# Patient Record
Sex: Female | Born: 2000 | Race: Black or African American | Hispanic: No | Marital: Single | Smoking: Never smoker
Health system: Southern US, Community
[De-identification: ages and names within clinical notes are randomized; demographics above are authoritative.]

## PROBLEM LIST (undated history)

## (undated) DIAGNOSIS — O24419 Gestational diabetes mellitus in pregnancy, unspecified control: Secondary | ICD-10-CM

## (undated) DIAGNOSIS — Z789 Other specified health status: Secondary | ICD-10-CM

## (undated) DIAGNOSIS — D649 Anemia, unspecified: Secondary | ICD-10-CM

## (undated) DIAGNOSIS — O2686 Pruritic urticarial papules and plaques of pregnancy (PUPPP): Secondary | ICD-10-CM

## (undated) HISTORY — DX: Other specified health status: Z78.9

## (undated) HISTORY — PX: NO PAST SURGERIES: SHX2092

---
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
## 2019-10-15 DIAGNOSIS — H04123 Dry eye syndrome of bilateral lacrimal glands: Secondary | ICD-10-CM | POA: Diagnosis not present

## 2019-10-15 DIAGNOSIS — H40033 Anatomical narrow angle, bilateral: Secondary | ICD-10-CM | POA: Diagnosis not present

## 2020-11-07 ENCOUNTER — Encounter (HOSPITAL_COMMUNITY): Payer: Self-pay

## 2020-11-07 ENCOUNTER — Emergency Department (HOSPITAL_COMMUNITY)
Admission: EM | Admit: 2020-11-07 | Discharge: 2020-11-07 | Disposition: A | Discharge status: Home / Self Care | Payer: Medicaid Other | Attending: Emergency Medicine | Admitting: Emergency Medicine

## 2020-11-07 ENCOUNTER — Other Ambulatory Visit: Payer: Self-pay

## 2020-11-07 DIAGNOSIS — W4904XA Ring or other jewelry causing external constriction, initial encounter: Secondary | ICD-10-CM | POA: Diagnosis not present

## 2020-11-07 DIAGNOSIS — S60414A Abrasion of right ring finger, initial encounter: Secondary | ICD-10-CM | POA: Diagnosis not present

## 2020-11-07 DIAGNOSIS — S60944A Unspecified superficial injury of right ring finger, initial encounter: Secondary | ICD-10-CM | POA: Diagnosis present

## 2020-11-07 DIAGNOSIS — S60449A External constriction of unspecified finger, initial encounter: Secondary | ICD-10-CM

## 2020-11-07 NOTE — ED Provider Notes (Signed)
[facility] EMERGENCY DEPARTMENT Provider Note   CSN: [id number] Arrival date & time: 11/07/20  1324     History Chief Complaint  Patient presents with  . Finger Injury    Ring stuck on finger    Marissa Chavez is a 19 y.o. female.  Marissa Chavez is a 19 y.o. female who is otherwise healthy, presents to the ED for evaluation of ring stuck on her right ring finger.  She states she typically wears this ring every day, woke up during the night and noted some pain and swelling to that ring finger and that she could not get her ring off.  She has tried multiple times to pull it off at home but she states that the seem to worsen the swelling and caused a small abrasion to the skin.  Denies any prior injury to the finger or hand.  No numbness tingling or weakness. Here requesting the ring be cut off.        History reviewed. No pertinent past medical history.  There are no problems to display for this patient.   History reviewed. No pertinent surgical history.   OB History   No obstetric history on file.     No family history on file.     Home Medications Prior to Admission medications   Not on File    Allergies    Patient has no known allergies.  Review of Systems   Review of Systems  Constitutional: Negative for chills and fever.  Musculoskeletal: Positive for joint swelling.  Skin: Positive for color change and wound. Negative for rash.  Neurological: Negative for weakness and numbness.    Physical Exam Updated Vital Signs BP 125/86   Pulse 73   Temp 98.3 F (36.8 C)   Resp 14   Ht 5\' 7"  (1.702 m)   Wt 56.7 kg   SpO2 100%   BMI 19.58 kg/m   Physical Exam Vitals and nursing note reviewed.  Constitutional:      General: She is not in acute distress.    Appearance: Normal appearance. She is well-developed and well-nourished. She is not ill-appearing or diaphoretic.  HENT:     Head: Normocephalic and atraumatic.  Eyes:      General:        Right eye: No discharge.        Left eye: No discharge.  Pulmonary:     Effort: Pulmonary effort is normal. No respiratory distress.  Musculoskeletal:     Comments: There slight swelling and erythema of the right ring finger, patient has full range of motion of the finger, normal sensation and strength, the ring that was stuck on the finger has already been removed, there is a slight abrasion just above where the ring was on the finger from patient trying to remove it but no other cuts or wounds to the finger or signs of infection.  Skin:    General: Skin is warm and dry.  Neurological:     Mental Status: She is alert and oriented to person, place, and time.     Coordination: Coordination normal.  Psychiatric:        Mood and Affect: Mood and affect and mood normal.        Behavior: Behavior normal.     ED Results / Procedures / Treatments   Labs (all labs ordered are listed, but only abnormal results are displayed) Labs Reviewed - No data to display  EKG None  Radiology  No results found.  Procedures Procedures (including critical care time)  Medications Ordered in ED Medications - No data to display  ED Course  I have reviewed the triage vital signs and the nursing notes.  Pertinent labs & imaging results that were available during my care of the patient were reviewed by me and considered in my medical decision making (see chart for details).    MDM Rules/Calculators/A&P                          19 year old female presents with ring stuck on her right ring finger, she tried multiple times to remove it at home but due to slight swelling she was not able to get the ring off and came in requesting it be cut.  ED tech was able to cut ring off, she has some very mild swelling but no other signs of infection, small abrasion was treated with bacitracin and a bandage.  Finger is neurovascularly intact with full range of motion.  Discussed that ring can likely be  fixed and enlarged at a jewelry store.  Instructed to avoid placing any rings or jewelry on the hand until all redness and swelling has completely resolved.  Discharged home in good condition.   Final Clinical Impression(s) / ED Diagnoses Final diagnoses:  Tight ring on finger    Rx / DC Orders ED Discharge Orders    None       Legrand Rams 11/07/20 0809    Maia Plan, MD 11/08/20 [phone]

## 2020-11-07 NOTE — ED Notes (Signed)
Ring cut off, and given to patient.

## 2020-11-07 NOTE — Discharge Instructions (Signed)
We were able to remove the ring, you can apply antibiotic cream and a bandage and swelling should improve. Avoid wearing any rings on this hand until swelling resolves completely

## 2020-11-07 NOTE — ED Triage Notes (Signed)
Patient here with ring stuck on right ring finger for several hours, complains of pain to same. Requesting ring be cut off

## 2020-11-28 NOTE — L&D Delivery Note (Signed)
Operative Delivery Note At 11:23 AM a viable and healthy female was delivered via Vaginal, Vacuum [employer] banker, operational).  Presentation: vertex; Position: Left,, Occiput,, Anterior; Station: +3.  Verbal consent: obtained from patient.  Risks and benefits discussed in detail.  Risks include, but are not limited to the risks of anesthesia, bleeding, infection, damage to maternal tissues, fetal cephalhematoma.  There is also the risk of inability to effect vaginal delivery of the head, or shoulder dystocia that cannot be resolved by established maneuvers, leading to the need for emergency cesarean section.  APGAR: 8, 9; weight pending.   Placenta status: spontaneous, intact. Large amount of bleeding and dark blood gush after delivery of fetal head with likely placental abruption. Cord: 3 vessel with the following complications: .  Cord pH: 7.04  Anesthesia:  epidural Instruments: Kiwi Episiotomy: None Lacerations: 1st degree;Perineal Suture Repair: 3.0 Monocryl Est. Blood Loss (mL): 500  Mom to postpartum.  Baby to Couplet care / Skin to Skin.  Marissa Chavez is a 20 y.o. female G1P0000 with IUP at [redacted]w[redacted]d admitted for active labor and vaginal bleeding .  She progressed rapidly without augmentation to complete and pushed through 2-3 contractions to deliver. FHR tracing with repetitive lates so CNM called to room. Pt was found to be complete with BBOW to introitus. FHR deceleration down to 80s remaining prolonged with bleeding on pad of ~ 100 ml with clots.  AROM with clear fluid and pt then complete and +2 station.  Pt pushed well x 1 contraction, moving baby to +3 station. Dr Ashok Pall to room and consented pt for vacuum. Vacuum applied and infant delivered with good patient effort and gentle traction by Dr Ashok Pall.  Infant transitioned well on maternal abdomen with stimulation with bulb syringe suction of mouth and nose by CNM and towel rub by RN. Apgar 8 at 1 minute.  Infant remained on maternal abdomen.  Cord clamping  delayed by 1 minute then clamped by Dr Ashok Pall and cut by significant other.  Placenta intact and spontaneous, brisk bleeding at time of delivery slowed and bleeding minimal after placenta delivered.  TXA IV and Cytotec given rectally along with routine administration of IV Pitocin.  First degree laceration repaired without difficulty.  Mom and baby stable prior to transfer to postpartum. She plans on breastfeeding.    Sharen Counter 08/18/2021, 12:20 PM

## 2021-02-08 LAB — OB RESULTS CONSOLE RUBELLA ANTIBODY, IGM: Rubella: IMMUNE

## 2021-02-08 LAB — OB RESULTS CONSOLE ABO/RH: RH Type: POSITIVE

## 2021-02-08 LAB — OB RESULTS CONSOLE HGB/HCT, BLOOD
HCT: 39 (ref 29–41)
Hemoglobin: 12.6

## 2021-02-08 LAB — OB RESULTS CONSOLE ANTIBODY SCREEN: Antibody Screen: NEGATIVE

## 2021-02-08 LAB — OB RESULTS CONSOLE HIV ANTIBODY (ROUTINE TESTING): HIV: NONREACTIVE

## 2021-02-08 LAB — OB RESULTS CONSOLE HEPATITIS B SURFACE ANTIGEN: Hepatitis B Surface Ag: NEGATIVE

## 2021-02-08 LAB — HEPATITIS C ANTIBODY: HCV Ab: NEGATIVE

## 2021-02-08 LAB — OB RESULTS CONSOLE RPR: RPR: NONREACTIVE

## 2021-02-08 LAB — OB RESULTS CONSOLE PLATELET COUNT: Platelets: 239

## 2021-02-08 LAB — SICKLE CELL SCREEN: Sickle Cell Screen: NEGATIVE

## 2021-02-08 LAB — OB RESULTS CONSOLE GC/CHLAMYDIA
Chlamydia: NEGATIVE
Gonorrhea: NEGATIVE

## 2021-03-10 ENCOUNTER — Encounter: Payer: Self-pay | Admitting: *Deleted

## 2021-03-29 ENCOUNTER — Ambulatory Visit (INDEPENDENT_AMBULATORY_CARE_PROVIDER_SITE_OTHER): Payer: Medicaid Other | Admitting: Student

## 2021-03-29 ENCOUNTER — Encounter: Payer: Self-pay | Admitting: Student

## 2021-03-29 ENCOUNTER — Other Ambulatory Visit: Payer: Self-pay

## 2021-03-29 VITALS — BP 117/72 | HR 83 | Wt 127.2 lb

## 2021-03-29 DIAGNOSIS — O418X20 Other specified disorders of amniotic fluid and membranes, second trimester, not applicable or unspecified: Secondary | ICD-10-CM | POA: Diagnosis not present

## 2021-03-29 DIAGNOSIS — O468X2 Other antepartum hemorrhage, second trimester: Secondary | ICD-10-CM | POA: Diagnosis not present

## 2021-03-29 DIAGNOSIS — O0990 Supervision of high risk pregnancy, unspecified, unspecified trimester: Secondary | ICD-10-CM | POA: Insufficient documentation

## 2021-03-29 DIAGNOSIS — O468X9 Other antepartum hemorrhage, unspecified trimester: Secondary | ICD-10-CM | POA: Insufficient documentation

## 2021-03-29 DIAGNOSIS — O418X90 Other specified disorders of amniotic fluid and membranes, unspecified trimester, not applicable or unspecified: Secondary | ICD-10-CM | POA: Insufficient documentation

## 2021-03-29 DIAGNOSIS — Z3400 Encounter for supervision of normal first pregnancy, unspecified trimester: Secondary | ICD-10-CM

## 2021-03-29 NOTE — Progress Notes (Signed)
  Subjective:    Marissa Chavez is being seen today for her first obstetrical visit.  This is not a planned pregnancy. She is at [redacted]w[redacted]d gestation. Her obstetrical history is significant for nothing. Marissa Chavez Kitchen She was a patient of [facility] but she has transferred to [facility] due to change in insurance. She is unhappy about this transfer. She is also unhappy because she thought she was going to have an [place] today and find out the gender. She has planned her gender reveal party for May 14 and is upset about the delay in scheduling her [place]. Relationship with FOB: not together. Patient does intend to breast feed. Pregnancy history fully reviewed.  Patient reports reports normal discharge, no bleeding, no contractions. . [place] at [facility] showed [facility] but patient has had no bleeding.    Review of Systems:   Review of Systems  Constitutional: Negative.   HENT: Negative.   Genitourinary: Negative.   Musculoskeletal: Negative.   Neurological: Negative.   Psychiatric/Behavioral: Negative.     Objective:     BP 117/72   Pulse 83   Wt 127 lb 3.2 oz (57.7 kg)   LMP 12/09/2020 (Approximate)   BMI 19.92 kg/m  Physical Exam Constitutional:      Appearance: Normal appearance.  Pulmonary:     Effort: Pulmonary effort is normal.  Musculoskeletal:     Cervical back: Normal range of motion.  Neurological:     Mental Status: She is alert.     Exam    Assessment:    Pregnancy: G2P0 Patient Active Problem List   Diagnosis Date Noted  . Supervision of low-risk first pregnancy 03/29/2021       Plan:     Initial labs drawn. Prenatal vitamins. Problem list reviewed and updated. AFP3 discussed: ordered. Role of ultrasound in pregnancy discussed; fetal survey: ordered. Amniocentesis discussed: not indicated. Follow up in 4 weeks. 75% of 30 min visit spent on counseling and coordination of care.  The nature of [facility] with multiple MDs and other Advanced Practice  Providers was explained to patient; also emphasized that residents, students are part of our team. -Discussed dating. Patient states that her period was irregular and she is not sure about her LMP. She says that Marissa Chavez was using her 12 week scan for dating. Will confirm at the [place] -Patient [place] scheduled for soonest possible date (May 18); advised patient that genetic testing may come back earlier but it would be prudent to delay her gender reveal party -Genetic testing done today -explained to patient that our practice will take great care of her, while we know she is disappointed about transferring.  -Discussed that she can see the same provider for the first few visits in order to get comfortable with the practice  Marissa Chavez [facility] 03/29/2021

## 2021-03-29 NOTE — Progress Notes (Signed)
Patient complains of occasional pressure in pelvic

## 2021-03-30 ENCOUNTER — Telehealth: Payer: Self-pay

## 2021-03-30 ENCOUNTER — Encounter: Payer: Self-pay | Admitting: *Deleted

## 2021-03-30 LAB — AFP, SERUM, OPEN SPINA BIFIDA
AFP MoM: 0.86
AFP Value: 51.6 ng/mL
Gest. Age on Collection Date: 18.5 weeks
Maternal Age At EDD: 20.9 yr
OSBR Risk 1 IN: 10000
Test Results:: NEGATIVE
Weight: 127 [lb_av]

## 2021-03-30 NOTE — Telephone Encounter (Signed)
Called patient today at 3:35pm and did not get an answer. I called patient again at 4:10pm and did not get an answer but I did leave a message stating that her ultrasound appointment has been scheduled for Apr 14, 2021 at 8am. I also stated that her ultrasound appointment will be taken place at our location on the second floor (Maternal Fetal Medicine).  Jaymie Misch, CMA

## 2021-03-31 ENCOUNTER — Encounter: Payer: Self-pay | Admitting: General Practice

## 2021-03-31 LAB — CULTURE, OB URINE

## 2021-03-31 LAB — URINE CULTURE, OB REFLEX

## 2021-04-09 ENCOUNTER — Inpatient Hospital Stay (HOSPITAL_BASED_OUTPATIENT_CLINIC_OR_DEPARTMENT_OTHER): Payer: Medicaid Other

## 2021-04-09 ENCOUNTER — Encounter (HOSPITAL_COMMUNITY): Payer: Self-pay | Admitting: Obstetrics & Gynecology

## 2021-04-09 ENCOUNTER — Inpatient Hospital Stay (HOSPITAL_COMMUNITY)
Admission: AD | Admit: 2021-04-09 | Discharge: 2021-04-09 | Disposition: A | Discharge status: Home / Self Care | Payer: Medicaid Other | Attending: Obstetrics & Gynecology | Admitting: Obstetrics & Gynecology

## 2021-04-09 DIAGNOSIS — Z3A20 20 weeks gestation of pregnancy: Secondary | ICD-10-CM

## 2021-04-09 DIAGNOSIS — O26892 Other specified pregnancy related conditions, second trimester: Secondary | ICD-10-CM | POA: Diagnosis present

## 2021-04-09 DIAGNOSIS — R102 Pelvic and perineal pain: Secondary | ICD-10-CM | POA: Diagnosis not present

## 2021-04-09 DIAGNOSIS — Z3686 Encounter for antenatal screening for cervical length: Secondary | ICD-10-CM | POA: Diagnosis not present

## 2021-04-09 DIAGNOSIS — O321XX0 Maternal care for breech presentation, not applicable or unspecified: Secondary | ICD-10-CM

## 2021-04-09 DIAGNOSIS — R109 Unspecified abdominal pain: Secondary | ICD-10-CM

## 2021-04-09 DIAGNOSIS — O26899 Other specified pregnancy related conditions, unspecified trimester: Secondary | ICD-10-CM

## 2021-04-09 LAB — URINALYSIS, ROUTINE W REFLEX MICROSCOPIC
Bacteria, UA: NONE SEEN
Bilirubin Urine: NEGATIVE
Glucose, UA: NEGATIVE mg/dL
Hgb urine dipstick: NEGATIVE
Ketones, ur: NEGATIVE mg/dL
Nitrite: NEGATIVE
Protein, ur: NEGATIVE mg/dL
Specific Gravity, Urine: 1.013 (ref 1.005–1.030)
pH: 8 (ref 5.0–8.0)

## 2021-04-09 MED ORDER — CYCLOBENZAPRINE HCL 5 MG PO TABS: 5.0000 mg | ORAL_TABLET | Freq: Three times a day (TID) | ORAL | 0 refills | Status: DC | PRN

## 2021-04-09 NOTE — Discharge Instructions (Signed)

## 2021-04-09 NOTE — MAU Provider Note (Signed)
History     CSN: [id number]  Arrival date and time: 04/09/21 1705   Event Date/Time   First Provider Initiated Contact with Patient 04/09/21 1925      Chief Complaint  Patient presents with  . Abdominal Cramping  . Abdominal Pain   HPI Marissa Chavez is a 20 y.o. G1P0000 at [redacted]w[redacted]d who presents with abdominal pain & pressure. Symptoms started yesterday and worsened this morning. Describe pressure in her vagina & pelvic pain that is worse when standing & walking. States the pressure feels like something is going to fall out of her vagina. Rates pain 5/10. Hasn't treated symptoms. Nothing makes better. Denies vaginal bleeding, LOF, vaginal discharge, or n/v/d.   OB History    Gravida  1   Para  0   Term  0   Preterm  0   AB  0   Living  0     SAB  0   IAB  0   Ectopic  0   Multiple  0   Live Births  0           Past Medical History:  Diagnosis Date  . Medical history non-contributory     Past Surgical History:  Procedure Laterality Date  . NO PAST SURGERIES      Family History  Problem Relation Age of Onset  . Hypertension Mother   . Depression Father   . Hypertension Maternal Grandfather   . Hypertension Paternal Grandmother   . Kidney disease Paternal Grandmother     Social History   Tobacco Use  . Smoking status: Never Smoker  . Smokeless tobacco: Never Used  Vaping Use  . Vaping Use: Never used  Substance Use Topics  . Alcohol use: Not Currently  . Drug use: Not Currently    Allergies: No Known Allergies  Medications Prior to Admission  Medication Sig Dispense Refill Last Dose  . Prenatal Vit-Fe Fumarate-FA (MULTIVITAMIN-PRENATAL) 27-0.8 MG TABS tablet Take 1 tablet by mouth daily at 12 noon.   04/08/2021 at Unknown time    Review of Systems  Constitutional: Negative.   Gastrointestinal: Positive for abdominal pain. Negative for constipation, diarrhea, nausea and vomiting.  Genitourinary: Positive for pelvic pain and vaginal  pain. Negative for dysuria, vaginal bleeding and vaginal discharge.   Physical Exam   Blood pressure 135/82, pulse 80, temperature 98 F (36.7 C), temperature source Oral, resp. rate 18, height 5\' 7"  (1.702 m), weight 59.4 kg, last menstrual period 12/09/2020, SpO2 100 %.  Physical Exam Vitals and nursing note reviewed. Exam conducted with a chaperone present.  Constitutional:      Appearance: She is well-developed.  Eyes:     General: No scleral icterus. Pulmonary:     Effort: Pulmonary effort is normal. No respiratory distress.  Abdominal:     Palpations: Abdomen is soft.  Genitourinary:    Comments: Cervix closed/firm/posterior Skin:    General: Skin is warm and dry.  Neurological:     Mental Status: She is alert.  Psychiatric:        Mood and Affect: Mood normal.        Behavior: Behavior normal.     MAU Course  Procedures Results for orders placed or performed during the hospital encounter of 04/09/21 (from the past 24 hour(s))  Urinalysis, Routine w reflex microscopic Urine, Clean Catch     Status: Abnormal   Collection Time: 04/09/21  7:03 PM  Result Value Ref Range   Color, Urine YELLOW YELLOW  APPearance HAZY (A) CLEAR   Specific Gravity, Urine 1.013 1.005 - 1.030   pH 8.0 5.0 - 8.0   Glucose, UA NEGATIVE NEGATIVE mg/dL   Hgb urine dipstick NEGATIVE NEGATIVE   Bilirubin Urine NEGATIVE NEGATIVE   Ketones, ur NEGATIVE NEGATIVE mg/dL   Protein, ur NEGATIVE NEGATIVE mg/dL   Nitrite NEGATIVE NEGATIVE   Leukocytes,Ua LARGE (A) NEGATIVE   RBC / HPF 0-5 0 - 5 RBC/hpf   WBC, UA 0-5 0 - 5 WBC/hpf   Bacteria, UA NONE SEEN NONE SEEN   Squamous Epithelial / LPF 11-20 0 - 5   Mucus PRESENT    No results found.  MDM FHR present via doppler U/A negative Cervix closed Cervical length 3.6 cm Pt stable do for discharge   Assessment and Plan   1. Abdominal pain during pregnancy in second trimester   2. [redacted] weeks gestation of pregnancy   3. Pain of round ligament  during pregnancy    -recommend maternity support belt -rx small amount of flexeril  -take tylenol prn -reviewed reasons to return to MAU  Judeth Horn 04/09/2021, 7:25 PM

## 2021-04-09 NOTE — MAU Note (Signed)
PT c/o abdominal pressure and cramping which is mostly at night or with movement. Worse today. Pain is 7/10. Has not taken anything for this. Denies VB, normal discharge.

## 2021-04-14 ENCOUNTER — Ambulatory Visit: Payer: Medicaid Other | Attending: Student

## 2021-04-14 ENCOUNTER — Other Ambulatory Visit: Payer: Self-pay

## 2021-04-14 DIAGNOSIS — Z3400 Encounter for supervision of normal first pregnancy, unspecified trimester: Secondary | ICD-10-CM | POA: Insufficient documentation

## 2021-04-20 ENCOUNTER — Telehealth: Payer: Self-pay | Admitting: Lactation Services

## 2021-04-20 NOTE — Telephone Encounter (Signed)
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   Reviewed it is recommended she call [employer] at [phone] to set up telephone Genetic Counseling Session to discuss results.   Reviewed that it is recommended FOB be tested to see if he carries the same gene.   FOB is planning to come to next appointment on 6/2 and can have testing done at that time.   Patient with no questions or concerns at this time.

## 2021-04-21 ENCOUNTER — Encounter: Payer: Self-pay | Admitting: *Deleted

## 2021-04-29 ENCOUNTER — Encounter: Payer: Self-pay | Admitting: Medical

## 2021-04-29 ENCOUNTER — Ambulatory Visit (INDEPENDENT_AMBULATORY_CARE_PROVIDER_SITE_OTHER): Payer: Medicaid Other | Admitting: Medical

## 2021-04-29 ENCOUNTER — Other Ambulatory Visit: Payer: Medicaid Other

## 2021-04-29 ENCOUNTER — Other Ambulatory Visit: Payer: Self-pay

## 2021-04-29 VITALS — BP 105/70 | HR 89 | Wt 137.1 lb

## 2021-04-29 DIAGNOSIS — D563 Thalassemia minor: Secondary | ICD-10-CM | POA: Insufficient documentation

## 2021-04-29 DIAGNOSIS — Z3A23 23 weeks gestation of pregnancy: Secondary | ICD-10-CM

## 2021-04-29 DIAGNOSIS — O468X2 Other antepartum hemorrhage, second trimester: Secondary | ICD-10-CM

## 2021-04-29 DIAGNOSIS — Z3402 Encounter for supervision of normal first pregnancy, second trimester: Secondary | ICD-10-CM

## 2021-04-29 DIAGNOSIS — O418X20 Other specified disorders of amniotic fluid and membranes, second trimester, not applicable or unspecified: Secondary | ICD-10-CM

## 2021-04-29 NOTE — Patient Instructions (Addendum)
Second Trimester of Pregnancy  The second trimester of pregnancy is from week 13 through week 27. This is also called months 4 through 6 of pregnancy. This is often the time when you feel your best. During the second trimester:  Morning sickness is less or has stopped.  You may have more energy.  You may feel hungry more often. At this time, your unborn baby (fetus) is growing very fast. At the end of the sixth month, the unborn baby may be up to 12 inches long and weigh about 1 pounds. You will likely start to feel the baby move between 16 and 20 weeks of pregnancy. Body changes during your second trimester Your body continues to go through many changes during this time. The changes vary and generally return to normal after the baby is born. Physical changes  You will gain more weight.  You may start to get stretch marks on your hips, belly (abdomen), and breasts.  Your breasts will grow and may hurt.  Dark spots or blotches may develop on your face.  A dark line from your belly button to the pubic area (linea nigra) may appear.  You may have changes in your hair. Health changes  You may have headaches.  You may have heartburn.  You may have trouble pooping (constipation).  You may have hemorrhoids or swollen, bulging veins (varicose veins).  Your gums may bleed.  You may pee (urinate) more often.  You may have back pain. Follow these instructions at home: Medicines  Take over-the-counter and prescription medicines only as told by your doctor. Some medicines are not safe during pregnancy.  Take a prenatal vitamin that contains at least 600 micrograms (mcg) of folic acid. Eating and drinking  Eat healthy meals that include: ? Fresh fruits and vegetables. ? Whole grains. ? Good sources of protein, such as meat, eggs, or tofu. ? Low-fat dairy products.  Avoid raw meat and unpasteurized juice, milk, and cheese.  You may need to take these actions to prevent or  treat trouble pooping: ? Drink enough fluids to keep your pee (urine) pale yellow. ? Eat foods that are high in fiber. These include beans, whole grains, and fresh fruits and vegetables. ? Limit foods that are high in fat and sugar. These include fried or sweet foods. Activity  Exercise only as told by your doctor. Most people can do their usual exercise during pregnancy. Try to exercise for 30 minutes at least 5 days a week.  Stop exercising if you have pain or cramps in your belly or lower back.  Do not exercise if it is too hot or too humid, or if you are in a place of great height (high altitude).  Avoid heavy lifting.  If you choose to, you may have sex unless your doctor tells you not to. Relieving pain and discomfort  Wear a good support bra if your breasts are sore.  Take warm water baths (sitz baths) to soothe pain or discomfort caused by hemorrhoids. Use hemorrhoid cream if your doctor approves.  Rest with your legs raised (elevated) if you have leg cramps or low back pain.  If you develop bulging veins in your legs: ? Wear support hose as told by your doctor. ? Raise your feet for 15 minutes, 3-4 times a day. ? Limit salt in your food. Safety  Wear your seat belt at all times when you are in a car.  Talk with your doctor if someone is hurting you or yelling  at you a lot. Lifestyle  Do not use hot tubs, steam rooms, or saunas.  Do not douche. Do not use tampons or scented sanitary pads.  Avoid cat litter boxes and soil used by cats. These carry germs that can harm your baby and can cause a loss of your baby by miscarriage or stillbirth.  Do not use herbal medicines, illegal drugs, or medicines that are not approved by your doctor. Do not drink alcohol.  Do not smoke or use any products that contain nicotine or tobacco. If you need help quitting, ask your doctor. General instructions  Keep all follow-up visits. This is important.  Ask your doctor about local  prenatal classes.  Ask your doctor about the right foods to eat or for help finding a counselor. Where to find more information  American Pregnancy Association: americanpregnancy.org  [employer] of Obstetricians and Gynecologists: www.acog.org  Office on [employer] Health: MightyReward.co.nz Contact a doctor if:  You have a headache that does not go away when you take medicine.  You have changes in how you see, or you see spots in front of your eyes.  You have mild cramps, pressure, or pain in your lower belly.  You continue to feel like you may vomit (nauseous), you vomit, or you have watery poop (diarrhea).  You have bad-smelling fluid coming from your vagina.  You have pain when you pee or your pee smells bad.  You have very bad swelling of your face, hands, ankles, feet, or legs.  You have a fever. Get help right away if:  You are leaking fluid from your vagina.  You have spotting or bleeding from your vagina.  You have very bad belly cramping or pain.  You have trouble breathing.  You have chest pain.  You faint.  You have not felt your baby move for the time period told by your doctor.  You have new or increased pain, swelling, or redness in an arm or leg. Summary  The second trimester of pregnancy is from week 13 through week 27 (months 4 through 6).  Eat healthy meals.  Exercise as told by your doctor. Most people can do their usual exercise during pregnancy.  Do not use herbal medicines, illegal drugs, or medicines that are not approved by your doctor. Do not drink alcohol.  Call your doctor if you get sick or if you notice anything unusual about your pregnancy. This information is not intended to replace advice given to you by your health care provider. Make sure you discuss any questions you have with your health care provider. Document Revised: 04/22/2020 Document Reviewed: 02/27/2020 Elsevier Patient Education  2021 Elsevier  Inc.  Childbirth Education Options: [facility] Department Classes:  Childbirth education classes can help you get ready for a positive parenting experience. You can also meet other expectant parents and get free stuff for your baby. Each class runs for five weeks on the same night and costs $45 for the mother-to-be and her support person. Medicaid covers the cost if you are eligible. Call [phone] to register. [facility] Childbirth Education:  [phone] or [phone] or [email or web address].[email or web address] .[email or web address]  Baby & Me Class: Discuss newborn & infant parenting and family adjustment issues with other new mothers in a relaxed environment. Each week brings a new speaker or baby-centered activity. We encourage new mothers to join [place] every Thursday at 11:00am. Babies birth until crawling. No registration or fee. Daddy [employer]: This course offers Dads-to-be the tools and knowledge needed to feel  confident on their journey to becoming new fathers. Experienced dads, who have been trained as coaches, teach dads-to-be how to hold, comfort, diaper, swaddle and play with their infant while being able to support the new mom as well. A class for men taught by men. $25/dad Big Brother/Big Sister: Let your children share in the joy of a new brother or sister in this special class designed just for them. Class includes discussion about how families care for babies: swaddling, holding, diapering, safety as well as how they can be helpful in their new role. This class is designed for children ages 2 to 57, but any age is welcome. Please register each child individually. $5/child  Mom Talk: This mom-led group offers support and connection to mothers as they journey through the adjustments and struggles of that sometimes overwhelming first year after the birth of a child. Tuesdays at 10:00am and Thursdays at 6:00pm. Babies welcome. No registration or fee. Breastfeeding Support Group: This group is a  mother-to-mother support circle where moms have the opportunity to share their breastfeeding experiences. A Lactation Consultant is present for questions and concerns. Meets each Tuesday at 11:00am. No fee or registration. Breastfeeding Your Baby: Learn what to expect in the first days of breastfeeding your newborn.  This class will help you feel more confident with the skills needed to begin your breastfeeding experience. Many new mothers are concerned about breastfeeding after leaving the hospital. This class will also address the most common fears and challenges about breastfeeding during the first few weeks, months and beyond. (call for fee) Comfort Techniques and Tour: This 2 hour interactive class will provide you the opportunity to learn & practice hands-on techniques that can help relieve some of the discomfort of labor and encourage your baby to rotate toward the best position for birth. You and your partner will be able to try a variety of labor positions with birth balls and rebozos as well as practice breathing, relaxation, and visualization techniques. A tour of the [facility] is included with this class. $20 per registrant and support person Childbirth Class- Weekend Option: This class is a Weekend version of our Birth & Baby series. It is designed for parents who have a difficult time fitting several weeks of classes into their schedule. It covers the care of your newborn and the basics of labor and childbirth. It also includes a Maternity Care Center Tour of [facility] and lunch. The class is held two consecutive days: beginning on Friday evening from 6:30 - 8:30 p.m. and the next day, Saturday from 9 a.m. - 4 p.m. (call for fee) Linden Dolin Class: Interested in a waterbirth?  This informational class will help you discover whether waterbirth is the right fit for you. Education about waterbirth itself, supplies you would need and how to assemble your support team  is what you can expect from this class. Some obstetrical practices require this class in order to pursue a waterbirth. (Not all obstetrical practices offer waterbirth-check with your healthcare provider.) Register only the expectant mom, but you are encouraged to bring your partner to class! Required if planning waterbirth, no fee. Infant/Child CPR: Parents, grandparents, babysitters, and friends learn Cardio-Pulmonary Resuscitation skills for infants and children. You will also learn how to treat both conscious and unconscious choking in infants and children. This Family & Friends program does not offer certification. Register each participant individually to ensure that enough mannequins are available. (Call for fee) Grandparent Love: Expecting a grandbaby? This class  is for you! Learn about the latest infant care and safety recommendations and ways to support your own child as he or she transitions into the parenting role. Taught by Registered Nurses who are childbirth instructors, but most importantly...they are grandmothers too! $10/person. Childbirth Class- Natural Childbirth: This series of 5 weekly classes is for expectant parents who want to learn and practice natural methods of coping with the process of labor and childbirth. Relaxation, breathing, massage, visualization, role of the partner, and helpful positioning are highlighted. Participants learn how to be confident in their body's ability to give birth. This class will empower and help parents make informed decisions about their own care. Includes discussion that will help new parents transition into the immediate postpartum period. Maternity Care Center Tour of [facility] is included. We suggest taking this class between 25-32 weeks, but it's only a recommendation. $75 per registrant and one support person or $30 Medicaid. Childbirth Class- 3 week Series: This option of 3 weekly classes helps you and your labor partner prepare for  childbirth. Newborn care, labor & birth, cesarean birth, pain management, and comfort techniques are discussed and a Maternity Care Center Tour of [facility] is included. The class meets at the same time, on the same day of the week for 3 consecutive weeks beginning with the starting date you choose. $60 for registrant and one support person.  Marvelous Multiples: Expecting twins, triplets, or more? This class covers the differences in labor, birth, parenting, and breastfeeding issues that face multiples' parents. NICU tour is included. Led by a Certified Childbirth Educator who is the mother of twins. No fee. Caring for Baby: This class is for expectant and adoptive parents who want to learn and practice the most up-to-date newborn care for their babies. Focus is on birth through the first six weeks of life. Topics include feeding, bathing, diapering, crying, umbilical cord care, circumcision care and safe sleep. Parents learn to recognize symptoms of illness and when to call the pediatrician. Register only the mom-to-be and your partner or support person can plan to come with you! $10 per registrant and support person Childbirth Class- online option: This online class offers you the freedom to complete a Birth and Baby series in the comfort of your own home. The flexibility of this option allows you to review sections at your own pace, at times convenient to you and your support people. It includes additional video information, animations, quizzes, and extended activities. Get organized with helpful eClass tools, checklists, and trackers. Once you register online for the class, you will receive an email within a few days to accept the invitation and begin the class when the time is right for you. The content will be available to you for 60 days. $60 for 60 days of online access for you and your support people.   AREA PEDIATRIC/FAMILY PRACTICE PHYSICIANS  Central/[place]outheast [place] ([id number]) . [facility] Melodie Bouillon, MD; Lum Babe, MD; Sheffield Slider, MD; Leveda Anna, MD; McDiarmid, MD; Jerene Bears, MD; Jennette Kettle, MD; Gwendolyn Grant, MD o [address] [place], [place] [address] o [phone] o Mon-Fri 8:30-12:30, 1:30-5:00 o Providers come to see babies at [facility] o Accepting Medicaid . [facility] o Limited providers who accept newborns: Docia Chuck, MD; Kateri Plummer, MD; Paulino Rily, MD o [address], [place], [place] [address] o [phone] o Mon-Fri 8:00-5:30 o Babies seen by providers at [facility] o Does NOT accept Medicaid o Please call early in hospitalization for appointment (limited  availability)  . [facility] Fatima Sanger, MD o [address], [place], [place] [address] o [phone] o Mon, Tue, Thur, Fri 8:30-5:00, Wed 10:00-7:00 (closed 1-2pm) o Babies seen by [facility] providers o Accepting Medicaid . Donnie Coffin - Pediatrician Fae Pippin, MD o [address], [place], [place] [address] o [phone] o Mon-Fri 8:30-5:00, Sat 8:30-12:00 o Provider comes to see babies at [facility] o Accepting Medicaid o Must have been referred from current patients or contacted office prior to delivery . [facility]) Leotis Pain, MD; Ave Filter, MD; Luna Fuse, MD; Kennedy Bucker, MD; Konrad Dolores, MD; Kathlene November, MD; Jenne Campus, MD; Lubertha South, MD; Wynetta Emery, MD; Duffy Rhody, MD; Gerre Couch, NP; Shirl Harris, NP o [address] [place], [place], [place] [address] o [phone] o Mon, Tue, Thur, Fri 8:30-5:30, Wed 9:30-5:30, Sat 8:30-12:30 o Babies seen by [facility] providers o Accepting Medicaid o Only accepting infants of first-time parents or siblings of current patients [facility] discharge coordinator will make follow-up appointment . Cyril Mourning o [address] [place], [place]  [address] o [phone]   Fax - [phone] . [facility] o [address], [place], [place]  [address] o Phone - [phone]   Fax - [phone] . Lucio Edward o [address], [place], [place]  [address] o [phone]  [place] [phone]) . [facility] Jorge Mandril, MD; Alita Chyle, MD; Princella Ion, MD; MD; Earlene Plater, MD; Jamesetta Orleans, MD; Alvera Novel, MD; Clarene Duke, MD; Rana Snare, MD; Carmon Ginsberg, MD; Alinda Money, MD; Hosie Poisson, MD; Mayford Knife, MD o [address] [place], [place] [address] o [phone] o Mon-Fri 8:30-5:00 (extended evenings Mon-Thur as needed), Sat-Sun 10:00-1:00 o Providers come to see babies at [facility] o Accepting Medicaid for families of first-time babies and families with all children in the household age 71 and under. Must register with office prior to making appointment (M-F only). [facility] Odella Aquas, NP; Lynelle Doctor, MD; Susann Givens, MD; [place], [place] o [address] [place], [place] [address] o [phone] o Mon-Fri 8:00-5:00 o Babies seen by providers at [facility] o Does NOT accept Medicaid/Commercial Insurance Only . [facility] [place] [facility]  Suzette Battiest, MD; Zachery Dauer, MD; Stefan Church, MD; Sabino Dick, MD; Quitman Livings, MD; Farris Has, MD; Gaynell Face, MD; Betha Loa, MD; Colon Flattery, MD; Clifton James, MD o [address] [place] [place], [place] [address] o [phone] o Mon-Fri 8:30-5:30, Sat (Oct.-Mar.) 9:00-1:00 o Babies seen by providers at [facility] o Accepting [facility] [phone]) . [facility] Dimitri, MD; Sheliah Hatch, MD o [address], [place], [place] [address] o [phone] o Mon-Fri 8:30-5:00, Sat 8:30-12:00 o Providers come to see babies at [facility] o Does NOT accept Medicaid . [facility] Cindy Hazy, [place]; [place], MD; [place], [place]; Wynelle Link, MD; Azucena Cecil, MD o [address] [place], [place] [address] o [phone] o Mon-Fri 8:00-5:00 o Babies seen by providers at [facility] o Does NOT accept Medicaid o Only accepting babies of parents who are patients o Please call early in hospitalization for appointment (limited availability) . [facility] Pediatric[facility] Lamar Benes, MD; Abran Cantor, MD; Early Osmond, MD; Cherre Huger, NP; Hyacinth Meeker, MD; Dwan Bolt, MD; Jarold Motto, NP; Dario Guardian, MD; Talmage Nap, MD; Maisie Fus, MD; Pricilla Holm, MD; Tama High, MD o [address] [place], [place], [place] [address] o [phone] o Mon-Fri 8:00-5:00, Sat 9:00-12:00 o Providers come to see babies at [facility]  o Does NOT accept [facility] ([id number]) . [facility] at [facility] o Limited providers accepting new patients: Drema Pry, NP; [place], [place] o [address], [place], [place] [address] o [phone] o Mon-Fri 8:00-5:00 o Babies seen by providers at [facility] o Does NOT accept Medicaid o Only accepting babies of parents who are patients o Please call early in hospitalization for appointment (limited availability) . [facility] Luan Pulling, MD; Nash Dimmer, MD o [address] [place] [place], [place] [address] o [phone] (press 1 to schedule appointment) o Mon-Fri 8:00-5:00 o Providers come to see babies at [facility] o Does NOT accept Medicaid . [facility] Cristino Martes, MD o [address] [place], [place] [address] o [phone] o Mon-Fri 8:30-5:00 (lunch 12:30-1:00), extended hours by appointment only Wed 5:00-6:30 o Babies seen by [facility] providers o Accepting Medicaid . [facility] Abbot, MD; [place], MD; Hassan Rowan, MD o [address] [place], [place], [place] [address] o [phone] o Mon-Fri 8:00-5:00 o Babies seen by [facility] providers o Does NOT accept Medicaid . Nature conservation officer at [place] [address] Elsworth Soho, MD; Durene Cal, MD; [place], DO o [address] [place], [place] [address] o [phone] o Mon-Fri 8:00-5:00 o Babies seen by [facility] providers o Does NOT accept  Medicaid . [facility] o [place], [place]; [place], [place]; [place], NP; Avis Epley, MD; Vonna Kotyk, MD; Clance Boll, MD; Stevphen Rochester, NP; Arvilla Market, NP; Ann Maki, NP; Otis Dials, NP; Vaughan Basta, MD; [place], MD o [address] [place], [place] [address] o [phone] o Mon-Fri 8:30-5:00, Sat 10:00-1:00 o Providers come to see babies at [facility] o Does NOT accept Medicaid o Free prenatal information session Tuesdays at 4:45pm . [facility] Luna Kitchens, MD; [place], [place]; [place], [place]; [place], [place] o [address] [place] [address] o [phone] o Mon-Fri 7:30-5:30 o Babies seen by [facility] providers . [facility] Children[facility] Doctor o [address], [place], [place]  [address] o [phone]   Fax - [phone]  [place] [phone] & [phone]) . [facility] Alphonsa Overall, MD o [address] [place], [place] [address] o [phone] o Mon-Thur 8:00-6:00 o Providers come to see babies at [facility] o Accepting Medicaid . [facility] Zenon Mayo, NP; Cyndia Bent, MD; [place], [place]; [place], [place] o [address] [place], [place] [address] o [phone] o Mon-Thur 7:30-7:30, Fri 7:30-4:30 o Babies seen by [facility] providers o Accepting Medicaid . [facility] Cheryle Horsfall, MD; Janene Harvey, NP; Vonita Moss, MD o [address], [place], [place] [address] o [phone] o Mon-Fri 8:30-5:00, Sat 8:30-12:00 o Providers come to see babies at [facility] o Accepting Medicaid o Must have "Meet & Greet" appointment at office prior to delivery . [facility] Llana Aliment, MD; Earlene Plater, MD; Lucretia Roers, MD o [address], [place], [place] [address] o [phone] o Mon-Wed 8:00-6:00, Thur-Fri 8:00-5:00, Sat 9:00-12:00 o Providers come to see babies at [facility] o Does NOT accept Medicaid o Only accepting siblings of current  patients . [facility]  o [address], [place], [place]  [address] o [phone]   Fax - [phone] . [facility] o [phone] [address], [place], [place]  [address] o [phone]   Fax - [phone]  [place] [phone] & [phone]) . Nature conservation officer at [employer] o [place], DO; [place], DO o [phone]  [address] [place], [place] [address] o [phone] o Mon-Fri 7:00-5:00 o Babies seen by [facility] providers o Does NOT accept Medicaid . [facility] Ellis Savageo Briscoe, MD; [place], [place]; [place], [place] o [address], [place], [place] [address] o [phone] o Mon-Fri 8:00-5:00 o Babies seen by [facility] providers o Accepting Medicaid . [facility] - [address] Franne Fortso Boyd, MD; [place], [place]; Sanborn[place], NP; [place], [place] o [address], [place], [place] [address] o [phone] o Mon-Fri 8:00-5:00 o Babies seen by providers at [facility] o Accepting [place]  [place] [phone] . [facility] [place], [place] o [address] [place], [place] [address] o [phone] o Mon-Fri 8:00-5:00 o Babies seen by [facility] providers o Does NOT accept Medicaid o Limited availability, please call early in hospitalization to schedule follow-up . [facility] Jolee Ewingo Calderon, PA; Eddie Candleummings, MD; Rio CommunitiesDillard, MD; [place], [place]; Constance Goltzlson, MD; Tarpe[place] VillageVanDeven, [place] o [id number] [facility] [address], [place], [place] [address] o [phone] o Mon-Fri 8:30-5:00, Sat 9:00-12:00 o Babies seen by providers at [facility] o Accepting Medicaid o Please register online then schedule online or call office o [email or web address] . [facility]) Samuella Bruino Hunter, NP; Lucianne MussKumar, MD; Lanier ClamMartin Rogers, PA o [address], [place], [place]  [address] o [phone] o Mon-Fri 8:00-5:00 o Babies seen by providers at [facility] o Accepting Medicaid . [facility] ([facility]) Sharin Monso [place], MD; Reed BreechKristi Fleenor, NP; Shelva MajesticWest, MD o [address], [place], [place] [address] o [phone] o Mon-Fri 8:00-5:30, Sat&Sun by appointment (phones open at 8:30) o Babies seen by [facility] providers o Accepting Medicaid o Must be a first-time baby or sibling of current patient . [facility]  o [address], [address], [place], [place]  [address] o [phone]   Fax - [phone]  [place] [phone]) . [facility] o [place], [place]; [place], [place]; Dimple Caseyice, MD; [place], [place]; Carolyne FiscalSpry, MD o [address] [place], [place] [address] o [phone] o Mon-Thur 8:00-7:00, Fri 8:00-5:00, Sat 8:00-12:00, Sun 9:00-12:00 o Babies seen by [facility] providers o Accepting Medicaid . [facility] at [facility] o Coe-Goins, MD; Gaynell FaceMarshall, MD; [facility]-Louis, MD o [address], [place], [place] [address] o [phone] o Mon-Thur 8:00-5:00 o Babies seen by providers at [facility] o Accepting Medicaid . [facility] [place] Gwenlyn Sarano Bratton, MD; Coe-Goins, MD; Madilyn FiremanHayes, MD; Melvyn NethLewis, MD; List, MD; Lazarus SalinesLott, MD; Gaynell FaceMarshall, MD; Berneda RoseMoran, MD; Flora Lipps'Neal, MD; Beryl MeagerPierre-Louis, MD; Luther RedoPitonzo, MD; Lavonia DraftsScholer, MD; Kellie SimmeringSpangle, MD o [address] [place], [place] [address] o [phone] o Mon-Fri 8:00-5:30, Sat (Oct.-Mar.) 9:00-1:00 o Babies seen by providers at [facility] o Accepting Medicaid o Must fill out new patient packet, available online at [email or web address] . [facility] [address] [facility] [facility]) Simone Curiao Friddle, NP; Tiburcio PeaHarris, NP; Tresa EndoKelly, NP; Whitney PostLogan, MD; [place] VillageMelvin, [place]; Hennie DuosPoth, MD; Wynne Dustamadoss, MD; Kavin LeechStanton, NP o [address], [place], [place]  [address] o [phone] o Mon-Thur 8:00-5:30, Fri 8:00-5:00 o Babies [facility] by providers at [facility] o Accepting [facility]  [place] [phone]) . [facility] o [place] Lake[place], [place]; NedrowDurham, MD; Tanya NonesPickard, MD; [place] Beachapia, [place] o [phone]  Delfin Edis [place], [place] [address] o [phone] o Mon-Fri 8:00-5:00 o Babies seen by providers at [facility] o Accepting [facility] [phone]) . [facility] at [facility] o [place], DO; Lenise Arena, MD; [place], [place] o [address], [place], [place] [address] o [phone] o Mon-Fri 8:00-5:00 o Babies seen by providers at [address] [facility] o Does NOT accept Medicaid o Limited appointment availability, please call early in hospitalization  . Nature conservation officer at [facility] o [place], DO; [place], MD o [address] [place], [place] [address] o [phone] o Mon-Fri 8:00-5:00 o Babies seen by [facility] providers o Does NOT accept Medicaid . [facility] Lorrine Kin, MD; Ninetta Lights, MD; [place], [place]; [place], MD o [address], [place], [place] [address] o [phone] o Mon-Fri 8:00-5:00 o After hours clinic [facility] [place], [place] [address]) [phone] Mon-Fri 5:00-8:00, Sat 12:00-6:00, Sun 10:00-4:00 o Babies seen by [facility] providers o Accepting Medicaid . [facility] o [address], [place], [place]  [address] o [phone]   Fax - [phone]  [place] [phone]) . Nature conservation officer at [facility], MD o [address] [place], [place], [place] [address] o [phone] o Mon-Fri 8:00-5:00 o Babies seen by [facility] providers o Does NOT accept Medicaid . [facility] [place] [facility] [place]) Tomi Likens, MD o [address] [place] [address] [place], [place] [address] o [phone] o Mon-Thur 8:00-7:00, Fri 8:00-5:00, Sat 8:00-12:00 o Babies seen by providers at  [facility] o Accepting Medicaid - but does not have vaccinations in office (must be received elsewhere) o Limited availability, please call early in hospitalization  Benjamin Perez ([phone]) . [facility]  o Wyvonne Lenz, MD o [address] [place] [address] o [phone]  Fax [phone]

## 2021-04-29 NOTE — Progress Notes (Signed)
   PRENATAL VISIT NOTE  Subjective:  Marissa Chavez is a 20 y.o. G1P0000 at 44w1dbeing seen today for ongoing prenatal care.  She is currently monitored for the following issues for this low-risk pregnancy and has Supervision of low-risk first pregnancy and Subchorionic hemorrhage on their problem list.  Patient reports fatigue.  Contractions: Irritability. Vag. Bleeding: None.  Movement: Present. Denies leaking of fluid.   The following portions of the patient's history were reviewed and updated as appropriate: allergies, current medications, past family history, past medical history, past social history, past surgical history and problem list.   Objective:   Vitals:   04/29/21 0950  BP: 105/70  Pulse: 89  Weight: 137 lb 1.6 oz (62.2 kg)    Fetal Status: Fetal Heart Rate (bpm): 146 Fundal Height: 23 cm Movement: Present     General:  Alert, oriented and cooperative. Patient is in no acute distress.  Skin: Skin is warm and dry. No rash noted.   Cardiovascular: Normal heart rate noted  Respiratory: Normal respiratory effort, no problems with respiration noted  Abdomen: Soft, gravid, appropriate for gestational age.  Pain/Pressure: Absent     Pelvic: Cervical exam deferred        Extremities: Normal range of motion.  Edema: None  Mental Status: Normal mood and affect. Normal behavior. Normal judgment and thought content.   Assessment and Plan:  Pregnancy: G1P0000 at 283w1d. Encounter for supervision of low-risk first pregnancy in second trimester - Doing well - Discussed importance of choosing peds prior to delivery and peds list given  - Planning IP circ - Does desires contraception PP, unsure what kind  2. Subchorionic hemorrhage of placenta in second trimester, single or unspecified fetus - Resolved on second trimester [place] 3. 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 [redacted] weeks gestation of pregnancy  Preterm labor symptoms  and general obstetric precautions including but not limited to vaginal bleeding, contractions, leaking of fluid and fetal movement were reviewed in detail with the patient. Please refer to After Visit Summary for other counseling recommendations.   Return in about 4 weeks (around 05/27/2021) for LOB, 28 week labs (fasting), In-Person, any provider.  Future Appointments  Date Time Provider [place]6/27/2022  8:15 AM NeCaren MacadamMD [facility]  JuKerry HoughPA-C

## 2021-05-24 ENCOUNTER — Encounter: Payer: Medicaid Other | Admitting: Family Medicine

## 2021-05-24 ENCOUNTER — Other Ambulatory Visit: Payer: Medicaid Other

## 2021-05-24 ENCOUNTER — Other Ambulatory Visit: Payer: Self-pay

## 2021-05-24 ENCOUNTER — Telehealth (INDEPENDENT_AMBULATORY_CARE_PROVIDER_SITE_OTHER): Payer: Medicaid Other | Admitting: Family Medicine

## 2021-05-24 DIAGNOSIS — Z3A26 26 weeks gestation of pregnancy: Secondary | ICD-10-CM

## 2021-05-24 DIAGNOSIS — O98513 Other viral diseases complicating pregnancy, third trimester: Secondary | ICD-10-CM | POA: Insufficient documentation

## 2021-05-24 DIAGNOSIS — Z202 Contact with and (suspected) exposure to infections with a predominantly sexual mode of transmission: Secondary | ICD-10-CM

## 2021-05-24 DIAGNOSIS — O98512 Other viral diseases complicating pregnancy, second trimester: Secondary | ICD-10-CM

## 2021-05-24 DIAGNOSIS — U071 COVID-19: Secondary | ICD-10-CM | POA: Insufficient documentation

## 2021-05-24 DIAGNOSIS — O468X2 Other antepartum hemorrhage, second trimester: Secondary | ICD-10-CM

## 2021-05-24 DIAGNOSIS — O418X10 Other specified disorders of amniotic fluid and membranes, first trimester, not applicable or unspecified: Secondary | ICD-10-CM

## 2021-05-24 DIAGNOSIS — Z3403 Encounter for supervision of normal first pregnancy, third trimester: Secondary | ICD-10-CM

## 2021-05-24 NOTE — Progress Notes (Signed)
Pt does not have BP cuff at home.

## 2021-05-24 NOTE — Progress Notes (Deleted)
Call placed to pt for missed appt. Spoke with pt. Pt states has Covid right now and feeling ok. Agreeable to have virtual appt with Dr Alvester Morin.

## 2021-05-24 NOTE — Progress Notes (Signed)
I connected with Marissa Chavez 05/24/21 at  2:15 PM EDT by: MyChart video and verified that I am speaking with the correct person using two identifiers.  Patient is located at home and provider is located at [facility].     The purpose of this virtual visit is to provide medical care while limiting exposure to the novel coronavirus. I discussed the limitations, risks, security and privacy concerns of performing an evaluation and management service by MyChart video and the availability of in person appointments. I also discussed with the patient that there may be a patient responsible charge related to this service. By engaging in this virtual visit, you consent to the provision of healthcare.  Additionally, you authorize for your insurance to be billed for the services provided during this visit.  The patient expressed understanding and agreed to proceed.  The following staff members participated in the virtual visit:  Gershon Crane    PRENATAL VISIT NOTE  Subjective:  Marissa Chavez is a 20 y.o. G1P0000 at [redacted]w[redacted]d  for  visit for ongoing prenatal care.  She is currently monitored for the following issues for this low-risk pregnancy and has Supervision of low-risk first pregnancy; Subchorionic hemorrhage; Alpha thalassemia silent carrier; Exposure to sexually transmitted disease (STD); and COVID-19 affecting pregnancy in third trimester on their problem list.  Patient reports no complaints.  Contractions: Irritability. Vag. Bleeding: None.  Movement: Present. Denies leaking of fluid.   The following portions of the patient's history were reviewed and updated as appropriate: allergies, current medications, past family history, past medical history, past social history, past surgical history and problem list.   Objective:  There were no vitals filed for this visit. Self-Obtained  Fetal Status:     Movement: Present     Assessment and Plan:  Pregnancy: G1P0000 at [redacted]w[redacted]d 1.  Exposure to sexually transmitted disease (STD) Partner tested positive for NGU at health department. He went to "just get checked" and patient reports he has no symptoms but she endorses new onset vaginal itching.  He was treated with 4 pills (suspect azithromycin) Patient was CT negative in March 2022 Discussed that she needs to be treated with azithromycin given exposure and need for testing as well.  Discussed risk of PTL with untreated STI and sexually transmitted nature of NGU. DDX for NGU includes chlamydia and trichamonas.   Patient reports itching sx for the past 3 days.   She will be a RN visit on 7/6 Once vaginal swab self collected please send in Azithromycin 1000mg  with no refills (partner was treated). If testing positive for trichomonas (high clinical suspicion)- recommend treatment per standing order.   2. Subchorionic hemorrhage of placenta in first trimester, single or unspecified fetus Denies bleeding  3. Encounter for supervision of low-risk first pregnancy in third trimester Up to date  Reviewed need for 3rd trimester testing  4. COVID-19 affecting pregnancy in third trimester Currently in isolation for covid, sx improving.    Preterm labor symptoms and general obstetric precautions including but not limited to vaginal bleeding, contractions, leaking of fluid and fetal movement were reviewed in detail with the patient.  Return in about 1 week (around 05/31/2021) for 2hr GTT and vaginal testings for GC/CT/Trich.  Future Appointments  Date Time Provider Department Center  06/02/2021  8:20 AM [facility] NURSE [facility]     Time spent on virtual visit: 20 minutes  [facility] MD

## 2021-05-24 NOTE — Progress Notes (Signed)
I connected with  Marissa Chavez on 05/24/21 at  2:15 PM EDT by telephone and verified that I am speaking with the correct person using two identifiers.   I discussed the limitations, risks, security and privacy concerns of performing an evaluation and management service by telephone and the availability of in person appointments. I also discussed with the patient that there may be a patient responsible charge related to this service. The patient expressed understanding and agreed to proceed.  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

## 2021-06-02 ENCOUNTER — Other Ambulatory Visit: Payer: Self-pay

## 2021-06-02 ENCOUNTER — Other Ambulatory Visit (HOSPITAL_COMMUNITY)
Admission: RE | Admit: 2021-06-02 | Discharge: 2021-06-02 | Disposition: A | Discharge status: Home / Self Care | Payer: Medicaid Other | Source: Ambulatory Visit | Attending: Family Medicine | Admitting: Family Medicine

## 2021-06-02 ENCOUNTER — Ambulatory Visit (INDEPENDENT_AMBULATORY_CARE_PROVIDER_SITE_OTHER): Payer: Medicaid Other | Admitting: General Practice

## 2021-06-02 VITALS — BP 104/71 | HR 86 | Ht 67.0 in | Wt 136.0 lb

## 2021-06-02 DIAGNOSIS — Z202 Contact with and (suspected) exposure to infections with a predominantly sexual mode of transmission: Secondary | ICD-10-CM | POA: Insufficient documentation

## 2021-06-02 DIAGNOSIS — Z3403 Encounter for supervision of normal first pregnancy, third trimester: Secondary | ICD-10-CM

## 2021-06-02 MED ORDER — AZITHROMYCIN 500 MG PO TABS
1000.0000 mg | ORAL_TABLET | Freq: Once | ORAL | Status: AC
  Administered 2021-06-02: 1000 mg via ORAL

## 2021-06-02 NOTE — Progress Notes (Signed)
Patient presents to office today as follow up from 6/27 virtual visit. Patient had partner test positive for NGU recently. She is also here today for 2 hr gtt. FHR 140bpm. Patient was instructed in self swab & specimen collected. Zithromax given in office per Dr Alvester Morin. Patient will follow up next week for OB f/u visit.  Chase Caller RN BSN 06/02/21

## 2021-06-02 NOTE — Progress Notes (Signed)
Attestation of Attending Supervision of clinical support staff: I agree with the care provided to this patient and was available for any consultation.  I have reviewed the RN's note and chart.   Federico Flake, MD, MPH, ABFM Attending Physician Faculty Practice- Center for [facility]

## 2021-06-03 ENCOUNTER — Encounter: Payer: Self-pay | Admitting: Family Medicine

## 2021-06-03 ENCOUNTER — Other Ambulatory Visit: Payer: Self-pay | Admitting: Family Medicine

## 2021-06-03 ENCOUNTER — Telehealth: Payer: Self-pay | Admitting: *Deleted

## 2021-06-03 DIAGNOSIS — O24419 Gestational diabetes mellitus in pregnancy, unspecified control: Secondary | ICD-10-CM | POA: Insufficient documentation

## 2021-06-03 DIAGNOSIS — Z3400 Encounter for supervision of normal first pregnancy, unspecified trimester: Secondary | ICD-10-CM

## 2021-06-03 DIAGNOSIS — O24410 Gestational diabetes mellitus in pregnancy, diet controlled: Secondary | ICD-10-CM

## 2021-06-03 DIAGNOSIS — O99019 Anemia complicating pregnancy, unspecified trimester: Secondary | ICD-10-CM | POA: Insufficient documentation

## 2021-06-03 LAB — GLUCOSE TOLERANCE, 2 HOURS W/ 1HR
Glucose, 1 hour: 191 mg/dL — ABNORMAL HIGH (ref 65–179)
Glucose, 2 hour: 132 mg/dL (ref 65–152)
Glucose, Fasting: 76 mg/dL (ref 65–91)

## 2021-06-03 LAB — CBC
Hematocrit: 32.3 % — ABNORMAL LOW (ref 34.0–46.6)
Hemoglobin: 10.3 g/dL — ABNORMAL LOW (ref 11.1–15.9)
MCH: 28.9 pg (ref 26.6–33.0)
MCHC: 31.9 g/dL (ref 31.5–35.7)
MCV: 91 fL (ref 79–97)
Platelets: 203 10*3/uL (ref 150–450)
RBC: 3.56 x10E6/uL — ABNORMAL LOW (ref 3.77–5.28)
RDW: 12.2 % (ref 11.7–15.4)
WBC: 7.8 10*3/uL (ref 3.4–10.8)

## 2021-06-03 LAB — CERVICOVAGINAL ANCILLARY ONLY
Bacterial Vaginitis (gardnerella): POSITIVE — AB
Candida Glabrata: NEGATIVE
Candida Vaginitis: NEGATIVE
Chlamydia: NEGATIVE
Comment: NEGATIVE
Comment: NEGATIVE
Comment: NEGATIVE
Comment: NEGATIVE
Comment: NEGATIVE
Comment: NORMAL
Neisseria Gonorrhea: NEGATIVE
Trichomonas: NEGATIVE

## 2021-06-03 LAB — RPR: RPR Ser Ql: NONREACTIVE

## 2021-06-03 LAB — HIV ANTIBODY (ROUTINE TESTING W REFLEX): HIV Screen 4th Generation wRfx: NONREACTIVE

## 2021-06-03 MED ORDER — ACCU-CHEK SMARTVIEW VI STRP
ORAL_STRIP | 12 refills | Status: DC
Start: 2021-06-03 — End: 2021-06-03

## 2021-06-03 MED ORDER — GLUCOSE BLOOD VI STRP: ORAL_STRIP | 12 refills | Status: DC

## 2021-06-03 MED ORDER — ACCU-CHEK SOFTCLIX LANCETS MISC: 120.0000 | Freq: Four times a day (QID) | 12 refills | Status: DC

## 2021-06-03 MED ORDER — ACCU-CHEK GUIDE W/DEVICE KIT: 1.0000 | PACK | Freq: Once | 0 refills | Status: AC

## 2021-06-03 MED ORDER — ACCU-CHEK NANO SMARTVIEW W/DEVICE KIT: 1.0000 | PACK | 0 refills | Status: DC

## 2021-06-03 NOTE — Telephone Encounter (Signed)
Rogene called and states she went to pharmacy to pick up her glucose meter and supplies and they told her doctor did not send in order correctly specifying how often she is to check her blood sugar. I informed her I will call pharmacy to correct the order. I reviewed her diabetes education appointment and instructed her to bring all her supplies with her to the appointment. She voices understanding.  I called pharmacy and gave new verbal orders Dwyne Hasegawa,RN

## 2021-06-04 ENCOUNTER — Encounter: Payer: Self-pay | Admitting: *Deleted

## 2021-06-04 ENCOUNTER — Telehealth: Payer: Self-pay | Admitting: *Deleted

## 2021-06-04 DIAGNOSIS — B9689 Other specified bacterial agents as the cause of diseases classified elsewhere: Secondary | ICD-10-CM

## 2021-06-04 MED ORDER — METRONIDAZOLE 500 MG PO TABS: 500.0000 mg | ORAL_TABLET | Freq: Two times a day (BID) | ORAL | 0 refills | Status: AC

## 2021-06-04 NOTE — Telephone Encounter (Signed)
-----   Message from Federico Flake, MD sent at 06/03/2021  1:32 PM EDT ----- Patient had gestational diabetes- prescriptions sent with mychart message. Referred to DM education please help set up for visit  Mild Anemia-- iron rich food Vaginal swab with BV-- no CT present, or trich-- patient's partner with NGU

## 2021-06-04 NOTE — Telephone Encounter (Signed)
I called Marissa Chavez to notify of mild anemia and we discussed I will send her foods rich in iron.  I also informed her of BV and rx will be sent in. Advised to take flagyl with food.  Also we discussed she is already aware of GDM and is picking up her supplies to bring to GDM education appointment. Rayon Mcchristian,RN

## 2021-06-10 ENCOUNTER — Encounter: Payer: Self-pay | Admitting: Obstetrics and Gynecology

## 2021-06-10 ENCOUNTER — Other Ambulatory Visit: Payer: Self-pay

## 2021-06-10 ENCOUNTER — Ambulatory Visit (INDEPENDENT_AMBULATORY_CARE_PROVIDER_SITE_OTHER): Payer: Medicaid Other | Admitting: Obstetrics and Gynecology

## 2021-06-10 VITALS — BP 115/72 | HR 73 | Wt 140.0 lb

## 2021-06-10 DIAGNOSIS — O98513 Other viral diseases complicating pregnancy, third trimester: Secondary | ICD-10-CM

## 2021-06-10 DIAGNOSIS — O24410 Gestational diabetes mellitus in pregnancy, diet controlled: Secondary | ICD-10-CM

## 2021-06-10 DIAGNOSIS — O99019 Anemia complicating pregnancy, unspecified trimester: Secondary | ICD-10-CM

## 2021-06-10 DIAGNOSIS — Z3A29 29 weeks gestation of pregnancy: Secondary | ICD-10-CM | POA: Insufficient documentation

## 2021-06-10 DIAGNOSIS — Z3403 Encounter for supervision of normal first pregnancy, third trimester: Secondary | ICD-10-CM

## 2021-06-10 DIAGNOSIS — U071 COVID-19: Secondary | ICD-10-CM

## 2021-06-10 DIAGNOSIS — Z202 Contact with and (suspected) exposure to infections with a predominantly sexual mode of transmission: Secondary | ICD-10-CM

## 2021-06-10 DIAGNOSIS — D563 Thalassemia minor: Secondary | ICD-10-CM

## 2021-06-10 MED ORDER — FERROUS SULFATE 325 (65 FE) MG PO TABS
325.0000 mg | ORAL_TABLET | ORAL | 2 refills | Status: DC
Start: 2021-06-10 — End: 2022-10-08

## 2021-06-10 NOTE — Progress Notes (Signed)
   PRENATAL VISIT NOTE  Subjective:  Marissa Chavez is a 20 y.o. G1P0000 at [redacted]w[redacted]d being seen today for ongoing prenatal care.  She is currently monitored for the following issues for this low-risk pregnancy and has Supervision of low-risk first pregnancy; Subchorionic hemorrhage; Alpha thalassemia silent carrier; Exposure to sexually transmitted disease (STD); COVID-19 affecting pregnancy in third trimester; Anemia during pregnancy; Gestational diabetes mellitus (GDM), antepartum; and [redacted] weeks gestation of pregnancy on their problem list.  Patient reports no complaints.  Contractions: Not present.  .  Movement: Present. Denies leaking of fluid.   The following portions of the patient's history were reviewed and updated as appropriate: allergies, current medications, past family history, past medical history, past social history, past surgical history and problem list.   Objective:   Vitals:   06/10/21 1603  BP: 115/72  Pulse: 73  Weight: 140 lb (63.5 kg)    Fetal Status: Fetal Heart Rate (bpm): 150   Movement: Present     General:  Alert, oriented and cooperative. Patient is in no acute distress.  Skin: Skin is warm and dry. No rash noted.   Cardiovascular: Normal heart rate noted  Respiratory: Normal respiratory effort, no problems with respiration noted  Abdomen: Soft, gravid, appropriate for gestational age.  Pain/Pressure: Absent     Pelvic: Cervical exam deferred        Extremities: Normal range of motion.  Edema: None  Mental Status: Normal mood and affect. Normal behavior. Normal judgment and thought content.   Assessment and Plan:  Pregnancy: G1P0000 at [redacted]w[redacted]d 1. Encounter for supervision of low-risk first pregnancy in third trimester, [redacted] weeks gestation of pregnancy: No red flag symptoms today. Discussed new diagnosis of A1GDM as noted below.  - Tdap vaccine administered today - RTC in 2 weeks for f/u prenatal appt; return precautions as below  2. Exposure to STD: Pt's  partner diagnosed with non-gonococcal urethritis. Pt s/p treatment with azithromycin. Reassuringly, cervicovaginal swab negative except for BV on 7/6 (now s/p course of flagyl). No current symptoms today.  3. Diet controlled gestational diabetes mellitus (GDM) in third trimester: Pt recently diagnosed with A1GDM based on abnormal 2hr gtt on 7/6. Pt has not yet started checking her BG levels but has picked up her supplies from the pharmacy. - counseled on importance of healthy eating and regular physical activity - discussed need to start QID BG checks s/p appt with DM educator on 7/19 (provided paper logs) - [place] MFM OB FOLLOW UP; Future (scheduled 07/05/21)  4. Alpha thalassemia silent carrier: previously discussed partner testing.  5. COVID-19 affecting pregnancy in third trimester: Resolved  6. Anemia during pregnancy: Hgb 10.3 on 7/6.  - start po iron supplement every other day  Preterm labor symptoms and general obstetric precautions including but not limited to vaginal bleeding, contractions, leaking of fluid and fetal movement were reviewed in detail with the patient. Please refer to After Visit Summary for other counseling recommendations.   Return in about 2 weeks (around 06/24/2021) for f/u prenatal appt in person in 2wks.  Future Appointments  Date Time Provider Department Center  06/15/2021 10:15 AM [facility]  06/28/2021  3:55 PM Conan Bowens, MD [facility]  07/05/2021  7:30 AM [facility] NURSE [facility]  07/05/2021  7:45 AM [facility] US5 [facility]    Sheila Oats, MD

## 2021-06-15 ENCOUNTER — Encounter: Payer: Medicaid Other | Attending: Family Medicine | Admitting: Registered"

## 2021-06-15 ENCOUNTER — Ambulatory Visit: Payer: Medicaid Other | Admitting: Registered"

## 2021-06-15 ENCOUNTER — Other Ambulatory Visit: Payer: Self-pay

## 2021-06-15 DIAGNOSIS — O24410 Gestational diabetes mellitus in pregnancy, diet controlled: Secondary | ICD-10-CM | POA: Diagnosis present

## 2021-06-15 DIAGNOSIS — O24419 Gestational diabetes mellitus in pregnancy, unspecified control: Secondary | ICD-10-CM

## 2021-06-15 NOTE — Progress Notes (Signed)
Patient was seen for Gestational Diabetes self-management on 06/15/21  Start time 1018 and End time 1115   Estimated due date: 08/25/21; [redacted]w[redacted]d  Clinical: Medications: prenatal, iron Medical History: reviewed Labs: OGTT 1 hr elevated, A1c n/a %   Dietary and Lifestyle History: Patient states she would like to understand the difference between Type 1, Type 2 and gestational diabetes.  Physical Activity: Stress: Sleep:  24 hr Recall: First Meal: Snack: Second meal: Snack: Third meal: Snack: Beverages:  NUTRITION INTERVENTION  Nutrition education (E-1) on the following topics:   Initial Follow-up  [x]  []  Definition of Gestational Diabetes [x]  []  Why dietary management is important in controlling blood glucose [x]  []  Effects each nutrient has on blood glucose levels [x]  []  Simple carbohydrates vs complex carbohydrates [x]  []  Fluid intake [x]  []  Creating a balanced meal plan [x]  []  Carbohydrate counting  [x]  []  When to check blood glucose levels [x]  []  Proper blood glucose monitoring techniques [x]  []  Effect of stress and stress reduction techniques  [x]  []  Exercise effect on blood glucose levels, appropriate exercise during pregnancy [x]  []  Importance of limiting caffeine and abstaining from alcohol and smoking [x]  []  Medications used for blood sugar control during pregnancy [x]  []  Hypoglycemia and rule of 15 [x]  []  Postpartum self care  Patient already has a meter, is not testing pre breakfast and 2 hours after each meal. Meter instruction provided CBG: 108 mg/dL   Patient instructed to monitor glucose levels: FBS: 60 - ? 95 mg/dL (some clinics use 90 for cutoff) 1 hour: ? 140 mg/dL 2 hour: ? mg/dL  Patient received handouts: Nutrition Diabetes and Pregnancy Carbohydrate Counting List  Patient will be seen for follow-up as needed.

## 2021-06-17 ENCOUNTER — Other Ambulatory Visit: Payer: Self-pay

## 2021-06-28 ENCOUNTER — Other Ambulatory Visit: Payer: Self-pay

## 2021-06-28 ENCOUNTER — Ambulatory Visit (INDEPENDENT_AMBULATORY_CARE_PROVIDER_SITE_OTHER): Payer: Medicaid Other | Admitting: Obstetrics and Gynecology

## 2021-06-28 ENCOUNTER — Encounter: Payer: Self-pay | Admitting: Obstetrics and Gynecology

## 2021-06-28 VITALS — BP 117/53 | HR 84 | Wt 143.5 lb

## 2021-06-28 DIAGNOSIS — O24419 Gestational diabetes mellitus in pregnancy, unspecified control: Secondary | ICD-10-CM

## 2021-06-28 DIAGNOSIS — Z3403 Encounter for supervision of normal first pregnancy, third trimester: Secondary | ICD-10-CM

## 2021-06-28 DIAGNOSIS — Z3A31 31 weeks gestation of pregnancy: Secondary | ICD-10-CM

## 2021-06-28 DIAGNOSIS — O99019 Anemia complicating pregnancy, unspecified trimester: Secondary | ICD-10-CM

## 2021-06-28 NOTE — Progress Notes (Signed)
   PRENATAL VISIT NOTE  Subjective:  Marissa Chavez is a 20 y.o. G1P0000 at [redacted]w[redacted]d being seen today for ongoing prenatal care.  She is currently monitored for the following issues for this low-risk pregnancy and has Supervision of low-risk first pregnancy; Subchorionic hemorrhage; Alpha thalassemia silent carrier; Exposure to sexually transmitted disease (STD); COVID-19 affecting pregnancy in third trimester; Anemia during pregnancy; Gestational diabetes mellitus (GDM), antepartum; and [redacted] weeks gestation of pregnancy on their problem list.  Patient reports  vaginal pressure .  Contractions: Not present. Vag. Bleeding: None.  Movement: Present. Denies leaking of fluid.   The following portions of the patient's history were reviewed and updated as appropriate: allergies, current medications, past family history, past medical history, past social history, past surgical history and problem list.   Objective:   Vitals:   06/28/21 1613  BP: (!) 117/53  Pulse: 84  Weight: 143 lb 8 oz (65.1 kg)    Fetal Status: Fetal Heart Rate (bpm): 139   Movement: Present     General:  Alert, oriented and cooperative. Patient is in no acute distress.  Skin: Skin is warm and dry. No rash noted.   Cardiovascular: Normal heart rate noted  Respiratory: Normal respiratory effort, no problems with respiration noted  Abdomen: Soft, gravid, appropriate for gestational age.  Pain/Pressure: Present     Pelvic: Cervical exam deferred        Extremities: Normal range of motion.  Edema: None  Mental Status: Normal mood and affect. Normal behavior. Normal judgment and thought content.   Assessment and Plan:  Pregnancy: G1P0000 at [redacted]w[redacted]d  1. Encounter for supervision of low-risk first pregnancy in third trimester Reviewed breastfeeding Encouraged newborn care class Gave info for area pediatricians  2. Gestational diabetes mellitus (GDM), antepartum, gestational diabetes method of control unspecified - following  diet, did not bring log FG: reports always 103 PP: reports they are mostly under 120, occasional high 120s Have improved as she is getting used to following diet  3. Anemia during pregnancy   Preterm labor symptoms and general obstetric precautions including but not limited to vaginal bleeding, contractions, leaking of fluid and fetal movement were reviewed in detail with the patient. Please refer to After Visit Summary for other counseling recommendations.   Return in about 2 weeks (around 07/12/2021) for high OB, in person.  Future Appointments  Date Time Provider Department Center  07/05/2021  7:30 AM [facility] NURSE [facility]  07/05/2021  7:45 AM [facility] US5 [facility]    Conan Bowens, MD

## 2021-06-28 NOTE — Patient Instructions (Signed)
AREA PEDIATRIC/FAMILY PRACTICE PHYSICIANS  [place] ([address]) [facility] Chambliss, MD; Eniola, MD; Hale, MD; Hensel, MD; McDiarmid, MD; McIntyer, MD; Neal, MD; Walden, MD [address] [place], [place] [address] [phone] Mon-Fri 8:30-12:30, 1:30-5:00 Providers come to see babies at [facility] Accepting Medicaid [facility] providers who accept newborns: Koirala, MD; Morrow, MD; Wolters, MD [address], [place], [place] [address] [phone] Mon-Fri 8:00-5:30 Babies seen by providers at [facility] Does NOT accept Medicaid Please call early in hospitalization for appointment (limited availability)  [facility] Mulberry, MD [address] [place], [place] [address] [phone] Mon, Tue, Thur, Fri 8:30-5:00, Wed 10:00-7:00 (closed 1-2pm) Babies seen by [facility] providers Accepting Medicaid Rubin - Pediatrician Rubin, MD [address], [place], [place] [address] [phone] Mon-Fri 8:30-5:00, Sat 8:30-12:00 Provider comes to see babies at [facility] Accepting Medicaid Must have been referred from current patients or contacted office prior to delivery [facility] ([facility]) Brown, MD; Chandler, MD; Ettefagh, MD; [place], MD; Lester, MD; McCormick, MD; McQueen, MD; Prose, MD; Simha, MD; Stanley, MD; Stryffeler, NP; Tebben, NP [address], [place], [place] [address] [phone] Mon, Tue, Thur, Fri 8:30-5:30, Wed 9:30-5:30, Sat 8:30-12:30 Babies seen by [facility] providers Accepting Medicaid Only accepting infants of first-time parents or siblings of current patients Hospital discharge coordinator will make follow-up appointment Jack Amos [address], [place], [place]  [address] [phone]   Fax - [phone] [facility] [address], [place], [place]  [address] Phone - [phone]   Fax - [phone] Shilpa Gosrani [address], [place], [place]  [address] [phone]  [place] ([address]) [facility] Bates, MD; Brassfield, MD; Cooper, Cox, MD; MD; Haylin Camilli, MD; Dovico, MD; Ettefaugh, MD; Little, MD; Lowe, MD; Keiffer, MD; Melvin, MD; Sumner, MD; Williams, MD [address], [place], [place] [address] [phone] Mon-Fri 8:30-5:00 (extended evenings Mon-Thur as needed), Sat-Sun 10:00-1:00 Providers come to see babies at [facility] Accepting Medicaid for families of first-time babies and families with all children in the household age 3 and under. Must register with office prior to making appointment (M-F only). [facility] Henson, NP; Knapp, MD; Lalonde, MD; Tysinger, PA [address] [place], [place] [address] [phone] Mon-Fri 8:00-5:00 Babies seen by providers at [facility] Does NOT accept Medicaid/Commercial Insurance Only [facility] ([facility])  Artis, MD; Barnes, MD; Bratton, MD; Coccaro, MD; [place] Gardner, MD; Kramer, MD; Marshall, MD; Netherton, MD; Poleto, MD; Skinner, MD [address] [place], [place] [address] [phone] Mon-Fri 8:30-5:30, Sat (Oct.-Mar.) 9:00-1:00 Babies seen by providers at [facility] Accepting Medicaid  [place] ([address]) [facility] Reid, MD; Warner, MD [address], [place], [place] [address] [phone] Mon-Fri 8:30-5:00, Sat 8:30-12:00 Providers come to see babies at [facility] Does NOT accept Medicaid [facility] Becker, PA; Hagler, MD; Scifres, PA; Sun, MD; Swayne, MD [address], [place], [place] [address] [phone] Mon-Fri 8:00-5:00 Babies seen by providers at [facility] Does NOT accept Medicaid Only accepting babies of parents who  are patients Please call early in hospitalization for appointment (limited availability)  Pediatricians Clark, MD; Frye, MD; Kelleher, MD; Mack, NP; Miller, MD; O'Keller, MD; Patterson, NP; Pudlo, MD; Puzio, MD; Thomas, MD; Tucker, MD; Twiselton, MD [address], [place], [place] [address] [phone] Mon-Fri 8:00-5:00, Sat 9:00-12:00 Providers come to see babies at [facility] Does NOT accept Medicaid  [place] ([address]) [facility] providers accepting new patients: Brake, NP; Wharton, PA [address], [place], [place] [address] [phone] Mon-Fri 8:00-5:00 Babies seen by providers at [facility] Does NOT accept Medicaid Only accepting babies of parents who are patients Please call early in hospitalization for appointment (limited availability) [facility] Gay, MD; Quinlan, MD [address] [place], [place] [address] [phone] (press [phone] to schedule appointment) Mon-Fri 8:00-5:00 Providers come to see babies at [facility] Does NOT accept Medicaid [facility] Mazer, MD [address] [place], [place] [address] [phone] Mon-Fri 8:30-5:00 (lunch 12:30-1:00), extended hours by appointment only Wed 5:00-6:30 Babies seen by [facility] providers Accepting Medicaid [facility] Banks, MD; Jordan, MD; Koberlein, MD [address], [place], [place] [address] [phone] Mon-Fri 8:00-5:00 Babies seen by [facility] providers Does NOT accept Medicaid [facility] Parker, MD; Hunter, MD; Wallace, DO [address] [place], [place] [address] [phone] Mon-Fri 8:00-5:00 Babies seen by [facility] providers Does NOT accept Medicaid [facility] Brandon, PA; Brecken, PA; Christy, NP; Dees, MD; DeClaire, MD; DeWeese, MD; Hansen, NP; Mills, NP; Parrish, NP; Smoot, NP; Summer, MD; Vapne,  MD [address] [place], [place] [address] [phone] Mon-Fri 8:30-5:00, Sat 10:00-1:00 Providers come to see babies at [facility] Does NOT accept Medicaid Free prenatal information session Tuesdays at 4:45pm [facility] Bouska, MD; Gordon, PA; Jeffery, PA; Weber, PA [address] [place] [address] [phone] Mon-Fri 7:30-5:30 Babies seen by [facility] providers [facility] [address], [place], [place]  [address] [phone]   Fax - [phone]  [place] ([address] & [address]) [facility] [place] [address] [place], [place] [address] [phone] Mon-Thur 8:00-6:00 Providers come to see babies at [facility] Accepting Medicaid [facility] Anderson, NP; Badger, MD; Beal, PA; Spencer, PA [address] [place], [place] [address] [phone] Mon-Thur 7:30-7:30, Fri 7:30-4:30 Babies seen by [facility] providers Accepting Medicaid [facility] Agbuya, MD; Klett, NP; Romgoolam, MD [address], [place], [place] [address] [phone] Mon-Fri 8:30-5:00, Sat 8:30-12:00 Providers come to see babies at [facility] Accepting Medicaid Must have "Meet & Greet" appointment at office prior to delivery [facility] McCord, MD; Wallace, MD; Wood, MD [address], [place], [place] [address] [phone] Mon-Wed 8:00-6:00, Thur-Fri 8:00-5:00, Sat 9:00-12:00 Providers come to see babies at [facility] Does NOT accept Medicaid Only accepting siblings of current patients [facility]  [address], [place], [place]  [address] [phone]   Fax - [phone] [facility] [address], , [place]  [address] [phone]   Fax -  [phone]  [place]  ([address] & [address]) [facility] Cirigliano, DO; Matthews, DO [address] ,  [address] [phone] Mon-Fri 7:00-5:00 Babies seen by [facility] providers Does NOT accept Medicaid [facility] Briscoe, MD; Howley, PA; Moreira, PA [address], [place],  [address] [phone] Mon-Fri 8:00-5:00 Babies seen by [facility] providers Accepting Medicaid [facility] Boyd, MD; Church, PA; Jones, NP; Osborn, PA [address], ,  [address] [phone] Mon-Fri 8:00-5:00 Babies seen by providers at [facility] Accepting Medicaid  [place] ([address]) [facility] [place] Wendling, DO [address] [place], [place] [address] [phone] Mon-Fri 8:00-5:00 Babies seen by [facility] providers Does NOT accept Medicaid Limited availability, please call early in hospitalization to schedule follow-up [facility] Calderon, PA; Cummings, MD; Dillard, MD; Martin, PA; Olson, MD; VanDeven, PA [address], [place], [place] [address] [phone] Mon-Fri 8:30-5:00, Sat 9:00-12:00 Babies seen by providers at [facility] Accepting Medicaid Please register online then schedule online or call office [email or web address]) Hunter, NP; Kumar, MD; Martin Rogers, PA [address], [place], [place] [address] [phone] Mon-Fri 8:00-5:00 Babies seen by providers at [facility] Accepting Medicaid [facility] ([facility]) [place], MD; Kristi Fleenor, NP; West, MD [address], [place], [place] [address] [phone] Mon-Fri 8:00-5:30, Sat&Sun by appointment (phones open at  8:30) Babies seen by [facility] providers Accepting Medicaid Must be a first-time baby or sibling of current patient [facility] [place]  [address], [place], [place]  [address] [phone]   Fax - [phone]  [place] ([address]) [facility] Brown, PA; Cowen, PA; Rice, MD; Helton, PA; Spry, MD [address] [place], [place] [address] [phone] Mon-Thur 8:00-7:00, Fri 8:00-5:00, Sat 8:00-12:00, Sun 9:00-12:00 Babies seen by [facility] providers Accepting Medicaid [facility] Coe-Goins, MD; Marshall, MD; Pierre-Louis, MD [address], [place], [place] [address] [phone] Mon-Thur 8:00-5:00 Babies seen by providers at [facility] Accepting Medicaid [facility] Bratton, MD; Coe-Goins, MD; Hayes, MD; Lewis, MD; List, MD; Lott, MD; Marshall, MD; Moran, MD; O'Neal, MD; Pierre-Louis, MD; Pitonzo, MD; Scholer, MD; Spangle, MD [address] [place], [place] [address] [phone] Mon-Fri 8:00-5:30, Sat (Oct.-Mar.) 9:00-1:00 Babies seen by providers at [facility] Accepting Medicaid Must fill out new patient packet, available online at [email or web address] [facility] ([facility]) Friddle, NP; Harris, NP; Emmamae Mcnamara, NP; Logan, MD; Melvin, PA; Poth, MD; Ramadoss, MD; Stanton, NP [address], [place], [place] [address] [phone] Mon-Thur 8:00-5:30, Fri 8:00-5:00 Babies seen by providers at [facility] Accepting Medicaid  [place] ([address]) [facility] Dixon, PA; Patterson, MD; Pickard, MD; Tapia, PA [address], [place], [place] [address] [phone] Mon-Fri 8:00-5:00 Babies seen by providers at [facility] Accepting Medicaid   [place] ([address]) [facility] [place] Masneri, DO; Meyers, MD; Nelson, PA [address], [place], [place] [address] [phone] Mon-Fri 8:00-5:00 Babies seen by providers at [facility] Does NOT accept Medicaid Limited appointment availability, please call early in hospitalization  [facility] [place] Kunedd, DO; McGowen, MD [address], [place], [place] [address] [phone] Mon-Fri 8:00-5:00 Babies seen by [facility] providers Does NOT accept Medicaid [facility] [place] Cameron, MD; MacDonald, MD; Michaels, PA; Nayak, MD [address] [place] [address], [place], [place] [address] [phone] Mon-Fri 8:00-5:00 After hours clinic ([address] [place], [place] [address]) [phone] Mon-Fri 5:00-8:00, Sat 12:00-6:00, Sun 10:00-4:00 Babies seen by [facility] providers Accepting Medicaid [facility] [place] [address], [place], [place]  [address] [phone]   Fax - [phone]  [place] ([address]) [facility] [place] [facility] Andy, MD [address], [place], [place] [address] [phone] Mon-Fri 8:00-5:00 Babies seen by [facility] providers Does NOT accept Medicaid [facility] [place] ([facility] [place]) Eksir, MD [address], [place], [place] [address] [phone] Mon-Thur 8:00-7:00, Fri 8:00-5:00, Sat 8:00-12:00 Babies seen by providers at [facility] Accepting Medicaid - but does not have vaccinations in office (must be received elsewhere) Limited availability, please call early in hospitalization  [place] ([address]) [facility]  Charlene Flemming, MD [address],  [place] [address] [phone]  Fax [phone]  [facility]  Kimberly Newton, MD, Annamarie Streilein, PA, Carla Hampton, PA [address] [place] [place], [place]  [address] [phone] [facility]  [address], [place], [place] [address] [phone] [address], [place], [place] [address] [phone] (West Office)  [facility] [address], [place], [place] [address] [phone] [facility] [address] [place], [place], [place] [address] [phone] [facility] [address], [place], [place] [address] [phone] [facility] [address], [place], [place] [address] [phone] [facility] [address], [place], [place] [address] [phone] [facility] [address], [place], [place] [address] [phone] [facility]  [address], [place], [place] [address] [phone] Dr[facility] [address] [place] [address], [place], [place] [address] [phone] [facility] [address], [place], [place] [address] [phone] [facility] [address], [place], [place] [address] [phone]  

## 2021-06-29 LAB — URINALYSIS
Bilirubin, UA: NEGATIVE
Glucose, UA: NEGATIVE
Ketones, UA: NEGATIVE
Nitrite, UA: NEGATIVE
Protein,UA: NEGATIVE
RBC, UA: NEGATIVE
Specific Gravity, UA: 1.018 (ref 1.005–1.030)
Urobilinogen, Ur: 1 mg/dL (ref 0.2–1.0)
pH, UA: 7 (ref 5.0–7.5)

## 2021-07-05 ENCOUNTER — Ambulatory Visit: Payer: Medicaid Other | Attending: Obstetrics and Gynecology | Admitting: Obstetrics and Gynecology

## 2021-07-05 ENCOUNTER — Other Ambulatory Visit: Payer: Self-pay | Admitting: *Deleted

## 2021-07-05 ENCOUNTER — Other Ambulatory Visit: Payer: Self-pay | Admitting: Obstetrics and Gynecology

## 2021-07-05 ENCOUNTER — Encounter: Payer: Self-pay | Admitting: *Deleted

## 2021-07-05 ENCOUNTER — Ambulatory Visit: Payer: Medicaid Other | Admitting: *Deleted

## 2021-07-05 ENCOUNTER — Ambulatory Visit: Payer: Medicaid Other | Attending: Obstetrics and Gynecology

## 2021-07-05 ENCOUNTER — Other Ambulatory Visit: Payer: Self-pay

## 2021-07-05 VITALS — BP 122/62 | HR 74

## 2021-07-05 DIAGNOSIS — O99019 Anemia complicating pregnancy, unspecified trimester: Secondary | ICD-10-CM | POA: Diagnosis present

## 2021-07-05 DIAGNOSIS — O24410 Gestational diabetes mellitus in pregnancy, diet controlled: Secondary | ICD-10-CM | POA: Insufficient documentation

## 2021-07-05 DIAGNOSIS — O24419 Gestational diabetes mellitus in pregnancy, unspecified control: Secondary | ICD-10-CM

## 2021-07-05 DIAGNOSIS — Z3A32 32 weeks gestation of pregnancy: Secondary | ICD-10-CM | POA: Diagnosis not present

## 2021-07-05 DIAGNOSIS — U071 COVID-19: Secondary | ICD-10-CM | POA: Insufficient documentation

## 2021-07-05 DIAGNOSIS — Z362 Encounter for other antenatal screening follow-up: Secondary | ICD-10-CM | POA: Diagnosis not present

## 2021-07-05 DIAGNOSIS — O98513 Other viral diseases complicating pregnancy, third trimester: Secondary | ICD-10-CM | POA: Diagnosis present

## 2021-07-05 NOTE — Progress Notes (Signed)
Maternal-Fetal Medicine   Name: Marissa Chavez DOB: [birth year] MRN: [id number] Referring Provider: Vivien Rota, MD   I had the pleasure of [facility]. She is G1 P0 at [place] gestation and is here for fetal growth assessment.  She has gestational diabetes. Patient met with our diabetic educator and started checking her blood glucose but not regularly.  She reports some of her levels are increased.  She has not brought her logbook today. Patient does not have hypertension.  Blood pressure today at her office is 122/62 mmHg She reports no other chronic medical conditions.  She had COVID-19 infection in this pregnancy and has completely recovered.  She also had non-gonococcal urethritis that was treated with azithromycin.  Ultrasound Fetal growth is appropriate for gestational age.  Amniotic fluid is normal good fetal activity seen.  Antenatal testing is reassuring.  BPP 8/8.  I counseled the patient on the following: Gestational diabetes -I emphasized the importance of good blood glucose control to prevent neonatal or fetal adverse outcomes including stillbirth.  I encouraged her to check her blood glucose regularly. -Complications include fetal macrosomia, shoulder dystocia and difficult delivery leading to neurological complications.  -Timing of delivery will be based on diabetic control.  If diabetes is well controlled she may be delivered at [redacted] weeks gestation.  If not well controlled, early term delivery is appropriate.  -Patient reported that she will be checking her blood glucose regularly.  I encouraged her to bring her logbook at her ultrasound visits.  -I discussed the ultrasound protocol of weekly antenatal testing.  Recommendations -Weekly BPP till delivery. -Patient to bring her blood glucose log at her ultrasound visits. -Postpartum screening for type 2 diabetes  Thank you for consultation.  If you have any questions or  concerns, please contact me the [facility].  Consultation including face-to-face (more than 50%) counseling 30 minutes.

## 2021-07-13 ENCOUNTER — Encounter: Payer: Self-pay | Admitting: Family Medicine

## 2021-07-13 ENCOUNTER — Ambulatory Visit: Payer: Medicaid Other | Attending: Obstetrics and Gynecology | Admitting: *Deleted

## 2021-07-13 ENCOUNTER — Ambulatory Visit (INDEPENDENT_AMBULATORY_CARE_PROVIDER_SITE_OTHER): Payer: Medicaid Other | Admitting: Family Medicine

## 2021-07-13 ENCOUNTER — Other Ambulatory Visit: Payer: Self-pay

## 2021-07-13 ENCOUNTER — Encounter: Payer: Self-pay | Admitting: *Deleted

## 2021-07-13 ENCOUNTER — Ambulatory Visit: Payer: Medicaid Other | Admitting: *Deleted

## 2021-07-13 VITALS — BP 116/73 | HR 84

## 2021-07-13 VITALS — BP 114/73 | HR 81 | Wt 146.0 lb

## 2021-07-13 DIAGNOSIS — Z3A33 33 weeks gestation of pregnancy: Secondary | ICD-10-CM | POA: Diagnosis not present

## 2021-07-13 DIAGNOSIS — Z3403 Encounter for supervision of normal first pregnancy, third trimester: Secondary | ICD-10-CM

## 2021-07-13 DIAGNOSIS — O24410 Gestational diabetes mellitus in pregnancy, diet controlled: Secondary | ICD-10-CM

## 2021-07-13 DIAGNOSIS — O24419 Gestational diabetes mellitus in pregnancy, unspecified control: Secondary | ICD-10-CM | POA: Diagnosis present

## 2021-07-13 DIAGNOSIS — U071 COVID-19: Secondary | ICD-10-CM

## 2021-07-13 DIAGNOSIS — O99019 Anemia complicating pregnancy, unspecified trimester: Secondary | ICD-10-CM

## 2021-07-13 DIAGNOSIS — O98513 Other viral diseases complicating pregnancy, third trimester: Secondary | ICD-10-CM

## 2021-07-13 NOTE — Progress Notes (Addendum)
   Subjective:  Marissa Chavez is a 20 y.o. G1P0000 at [redacted]w[redacted]d being seen today for ongoing prenatal care.  She is currently monitored for the following issues for this high-risk pregnancy and has Supervision of low-risk first pregnancy; Subchorionic hemorrhage; Alpha thalassemia silent carrier; Exposure to sexually transmitted disease (STD); COVID-19 affecting pregnancy in third trimester; Anemia during pregnancy; Gestational diabetes mellitus (GDM), antepartum; and [redacted] weeks gestation of pregnancy on their problem list.  Patient reports no complaints.  Contractions: Irritability. Vag. Bleeding: None.  Movement: Present. Denies leaking of fluid.   The following portions of the patient's history were reviewed and updated as appropriate: allergies, current medications, past family history, past medical history, past social history, past surgical history and problem list. Problem list updated.  Objective:   Vitals:   07/13/21 1534  BP: 114/73  Pulse: 81  Weight: 146 lb (66.2 kg)    Fetal Status: Fetal Heart Rate (bpm): 147   Movement: Present     General:  Alert, oriented and cooperative. Patient is in no acute distress.  Skin: Skin is warm and dry. No rash noted.   Cardiovascular: Normal heart rate noted  Respiratory: Normal respiratory effort, no problems with respiration noted  Abdomen: Soft, gravid, appropriate for gestational age. Pain/Pressure: Present     Pelvic: Vag. Bleeding: None     Cervical exam deferred        Extremities: Normal range of motion.  Edema: None  Mental Status: Normal mood and affect. Normal behavior. Normal judgment and thought content.   Urinalysis:      Assessment and Plan:  Pregnancy: G1P0000 at [redacted]w[redacted]d  1. Encounter for supervision of low-risk first pregnancy in third trimester BP and FHR normal Discussed contraception, still undecided, referred to bedsider After counseling accepts TDaP  2. Diet controlled gestational diabetes mellitus (GDM),  antepartum Did not bring log Not checking consistently Reports consistent 80's in the mornings Reports 80's for 2hr post prandials as well Reviewed importance of checking regularly to assess risk of stillbirth, macrosomia, etc.  3. Anemia during pregnancy Last hgb 10.3, cont prenatals  Preterm labor symptoms and general obstetric precautions including but not limited to vaginal bleeding, contractions, leaking of fluid and fetal movement were reviewed in detail with the patient. Please refer to After Visit Summary for other counseling recommendations.  Return in 2 weeks (on 07/27/2021) for [facility], ob visit.   Venora Maples, MD

## 2021-07-13 NOTE — Patient Instructions (Signed)

## 2021-07-13 NOTE — Procedures (Signed)
Marissa Chavez 2001/11/15 [redacted]w[redacted]d  Fetus A Non-Stress Test Interpretation for 07/13/21  Indication:  GDM  Fetal Heart Rate A Mode: External Baseline Rate (A): 140 bpm Variability: Moderate Accelerations: 15 x 15 Decelerations: Variable Multiple birth?: No  Uterine Activity Mode: Palpation, Toco Contraction Frequency (min): none Resting Tone Palpated: Relaxed  Interpretation (Fetal Testing) Nonstress Test Interpretation: Reactive Overall Impression: Reassuring for gestational age Comments: Dr. Grace Bushy reviewed tracing

## 2021-07-20 ENCOUNTER — Encounter: Payer: Self-pay | Admitting: *Deleted

## 2021-07-20 ENCOUNTER — Ambulatory Visit: Payer: Medicaid Other | Attending: Obstetrics and Gynecology

## 2021-07-20 ENCOUNTER — Other Ambulatory Visit: Payer: Self-pay

## 2021-07-20 ENCOUNTER — Ambulatory Visit: Payer: Medicaid Other | Admitting: *Deleted

## 2021-07-20 VITALS — BP 116/65 | HR 69

## 2021-07-20 DIAGNOSIS — U071 COVID-19: Secondary | ICD-10-CM | POA: Diagnosis present

## 2021-07-20 DIAGNOSIS — O99019 Anemia complicating pregnancy, unspecified trimester: Secondary | ICD-10-CM | POA: Insufficient documentation

## 2021-07-20 DIAGNOSIS — O24419 Gestational diabetes mellitus in pregnancy, unspecified control: Secondary | ICD-10-CM | POA: Diagnosis present

## 2021-07-20 DIAGNOSIS — Z3403 Encounter for supervision of normal first pregnancy, third trimester: Secondary | ICD-10-CM | POA: Diagnosis present

## 2021-07-20 DIAGNOSIS — O24410 Gestational diabetes mellitus in pregnancy, diet controlled: Secondary | ICD-10-CM

## 2021-07-20 DIAGNOSIS — O99013 Anemia complicating pregnancy, third trimester: Secondary | ICD-10-CM

## 2021-07-20 DIAGNOSIS — O98513 Other viral diseases complicating pregnancy, third trimester: Secondary | ICD-10-CM | POA: Insufficient documentation

## 2021-07-20 DIAGNOSIS — D649 Anemia, unspecified: Secondary | ICD-10-CM | POA: Diagnosis not present

## 2021-07-20 DIAGNOSIS — Z362 Encounter for other antenatal screening follow-up: Secondary | ICD-10-CM

## 2021-07-20 DIAGNOSIS — Z148 Genetic carrier of other disease: Secondary | ICD-10-CM

## 2021-07-20 DIAGNOSIS — Z3A34 34 weeks gestation of pregnancy: Secondary | ICD-10-CM

## 2021-07-27 ENCOUNTER — Ambulatory Visit (INDEPENDENT_AMBULATORY_CARE_PROVIDER_SITE_OTHER): Payer: Medicaid Other | Admitting: Family Medicine

## 2021-07-27 ENCOUNTER — Other Ambulatory Visit: Payer: Self-pay

## 2021-07-27 ENCOUNTER — Ambulatory Visit: Payer: Medicaid Other

## 2021-07-27 VITALS — BP 119/80 | HR 65 | Wt 151.6 lb

## 2021-07-27 DIAGNOSIS — O0990 Supervision of high risk pregnancy, unspecified, unspecified trimester: Secondary | ICD-10-CM

## 2021-07-27 DIAGNOSIS — Z3A35 35 weeks gestation of pregnancy: Secondary | ICD-10-CM

## 2021-07-27 DIAGNOSIS — O24410 Gestational diabetes mellitus in pregnancy, diet controlled: Secondary | ICD-10-CM

## 2021-07-27 DIAGNOSIS — O99019 Anemia complicating pregnancy, unspecified trimester: Secondary | ICD-10-CM

## 2021-07-27 NOTE — Progress Notes (Signed)
Opened in error

## 2021-07-27 NOTE — Patient Instructions (Signed)
Preterm Labor The normal length of a pregnancy is 39-41 weeks. Preterm labor is when labor starts before 37 completed weeks of pregnancy. Babies who are born prematurely and survive may not be fully developed and may be at an increased risk for long-term problems such as cerebral palsy, developmental delays, and vision andhearing problems. Babies who are born too early may have problems soon after birth. Premature babies may have problems regulating blood sugar, body temperature, heart rate, and breathing rate. These babies often have trouble with feeding. The risk ofhaving problems is highest for babies who are born before 34 weeks of pregnancy. What are the causes? The exact cause of this condition is not known. What increases the risk? You are more likely to have preterm labor if you have certain risk factors that relate to your medical history, problems with present and past pregnancies, andlifestyle factors. Medical history You have abnormalities of the uterus, including a short cervix. You have STIs (sexually transmitted infections) or other infections of the urinary tract and the vagina. You have chronic illnesses, such as blood clotting problems, diabetes, or high blood pressure. You are overweight or underweight. Present and past pregnancies You have had preterm labor before. You are pregnant with twins or other multiples. You have been diagnosed with a condition in which the placenta covers your cervix (placenta previa). You waited less than 18 months between giving birth and becoming pregnant again. Your unborn baby has some abnormalities. You have vaginal bleeding during pregnancy. You became pregnant through in vitro fertilization (IVF). Lifestyle and environmental factors You use tobacco products or drink alcohol. You use drugs. You have stress and no social support. You experience domestic violence. You are exposed to certain chemicals or environmental pollutants. Other  factors You are younger than age 17 or older than age 35. What are the signs or symptoms? Symptoms of this condition include: Cramps similar to those that can happen during a menstrual period. The cramps may happen with diarrhea. Pain in the abdomen or lower back. Regular contractions that may feel like tightening of the abdomen. A feeling of increased pressure in the pelvis. Increased watery or bloody mucus discharge from the vagina. Water breaking (ruptured amniotic sac). How is this diagnosed? This condition is diagnosed based on: Your medical history and a physical exam. A pelvic exam. An ultrasound. Monitoring your uterus for contractions. Other tests, including: A swab of the cervix to check for a chemical called fetal fibronectin. Urine tests. How is this treated? Treatment for this condition depends on the length of your pregnancy, your condition, and the health of your baby. Treatment may include: Taking medicines, such as: Hormone medicines. These may be given early in pregnancy to help support the pregnancy. Medicines to stop contractions. Medicines to help mature the baby's lungs. These may be prescribed if the risk of delivery is high. Medicines to help protect your baby from brain and nerve complications such as cerebral palsy. Bed rest. If the labor happens before 34 weeks of pregnancy, you may need to stay in the hospital. Delivery of the baby. Follow these instructions at home:  Do not use any products that contain nicotine or tobacco. These products include cigarettes, chewing tobacco, and vaping devices, such as e-cigarettes. If you need help quitting, ask your health care provider. Do not drink alcohol. Take over-the-counter and prescription medicines only as told by your health care provider. Rest as told by your health care provider. Return to your normal activities as told by your   health care provider. Ask your health care provider what activities are safe for  you. Keep all follow-up visits. This is important. How is this prevented? To increase your chance of having a full-term pregnancy: Do not use drugs or take medicines that have not been prescribed to you during your pregnancy. Talk with your health care provider before taking any herbal supplements, even if you have been taking them regularly. Make sure you gain a healthy amount of weight during your pregnancy. Watch for infection. If you think that you might have an infection, get it checked right away. Symptoms of infection may include: Fever. Abnormal vaginal discharge or discharge that smells bad. Pain or burning with urination. Needing to urinate urgently. Frequently urinating or passing small amounts of urine frequently. Blood in your urine or urine that smells bad or unusual. Where to find more information U.S. Department of Health and Human Services Office on Women's Health: www.womenshealth.gov The American College of Obstetricians and Gynecologists: www.acog.org Centers for Disease Control and Prevention, Preterm Birth: www.cdc.gov Contact a health care provider if: You think you are going into preterm labor. You have signs or symptoms of preterm labor. You have symptoms of infection. Get help right away if: You are having regular, painful contractions every 5 minutes or less. Your water breaks. Summary Preterm labor is labor that starts before you reach 37 weeks of pregnancy. Delivering your baby early increases your baby's risk of developing long-term problems. You are more likely to have preterm labor if you have certain risk factors that relate to your medical history, problems with present and past pregnancies, and lifestyle factors. Keep all follow-up visits. This is important. Contact a health care provider if you have signs or symptoms of preterm labor. This information is not intended to replace advice given to you by your health care provider. Make sure you discuss  any questions you have with your healthcare provider. Document Revised: 11/17/2020 Document Reviewed: 11/17/2020 Elsevier Patient Education  2022 Elsevier Inc.  

## 2021-07-27 NOTE — Progress Notes (Signed)
   PRENATAL VISIT NOTE  Subjective:  Marissa Chavez is a 20 y.o. G1P0000 at 45w6dbeing seen today for ongoing prenatal care.  She is currently monitored for the following issues for this high-risk pregnancy and has Supervision of high risk pregnancy, antepartum; Subchorionic hemorrhage; Alpha thalassemia silent carrier; Exposure to sexually transmitted disease (STD); COVID-19 affecting pregnancy in third trimester; Anemia during pregnancy; and Gestational diabetes mellitus (GDM), antepartum on their problem list.  Patient reports no complaints.  Contractions: Irritability. Vag. Bleeding: None.  Movement: Present. Denies leaking of fluid.   Patient reports that since she has met with the nutritionist she has been eating better since last visit. She is eating fruits and trying to eat more fish.  She is checking her sugars but did not bring log with her today. She reports that her fasting glucose is in the 70s-80s. Her post-prandial glucose 2 hours after a meal is around 115. Lunch is her biggest meal and she states that the postprandial glucose after lunch is usually 115-120.  The following portions of the patient's history were reviewed and updated as appropriate: allergies, current medications, past family history, past medical history, past social history, past surgical history and problem list.   Objective:   Vitals:   07/27/21 0856  BP: 119/80  Pulse: 65  Weight: 151 lb 9.6 oz (68.8 kg)    Fetal Status: Fetal Heart Rate (bpm): 144 Fundal Height: 36 cm Movement: Present     General:  Alert, oriented and cooperative. Patient is in no acute distress.  Skin: Skin is warm and dry. No rash noted.   Cardiovascular: Normal heart rate noted.  Respiratory: Normal respiratory effort, no problems with respiration noted.  Abdomen: Soft, gravid, appropriate for gestational age.  Pain/Pressure: Absent     Pelvic: Cervical exam deferred.        Extremities: Normal range of motion.  Edema: None   Mental Status: Normal mood and affect. Normal behavior. Normal judgment and thought content.   Assessment and Plan:  Pregnancy: G1P0000 at 358w6d1. Supervision of high risk pregnancy, antepartum 2. [redacted] weeks gestation of pregnancy Progressing well without complaints today. FH and FHT within normal limits. Discussed Tdap vaccine given that patient has not received this previously. Patient prefers to receive Tdap postpartum. Follow up for ROB in 1 week. Plan to obtain GBS and GC/CT testing next visit.   3. Diet controlled gestational diabetes mellitus (GDM), antepartum Patient has forgotten her log again this visit. Emphasized importance of bringing log to appointments to assess for appropriate glucose control and minimize complications that may affect her baby. Glucose values relayed by patient satisfactory. Working on dietary changes with the help of nutrition. Patient voiced that she will bring her log next visit. Will plan to review more in depth at that time.  4. Anemia during pregnancy Complaint with PO iron supplementation. Consider repeat CBC next visit to ensure stable hemoglobin trend.  Preterm labor symptoms and general obstetric precautions including but not limited to vaginal bleeding, contractions, leaking of fluid and fetal movement were reviewed in detail with the patient.  Please refer to After Visit Summary for other counseling recommendations.   Return in about 1 week (around 08/03/2021) [facility]  Future Appointments  Date Time Provider [place]9/04/2021  9:00 AM [facility]04[facility]2021  9:15 AM [facility] US2 [facility]/06/2021 10:15 AM PiAletha HalimMD [facility]  ChGenia DelMD

## 2021-07-30 ENCOUNTER — Other Ambulatory Visit: Payer: Self-pay

## 2021-07-30 ENCOUNTER — Inpatient Hospital Stay (HOSPITAL_COMMUNITY)
Admission: AD | Admit: 2021-07-30 | Discharge: 2021-07-30 | Disposition: A | Discharge status: Home / Self Care | Payer: Medicaid Other | Attending: Obstetrics & Gynecology | Admitting: Obstetrics & Gynecology

## 2021-07-30 DIAGNOSIS — R600 Localized edema: Secondary | ICD-10-CM | POA: Diagnosis not present

## 2021-07-30 DIAGNOSIS — Z3A00 Weeks of gestation of pregnancy not specified: Secondary | ICD-10-CM | POA: Insufficient documentation

## 2021-07-30 DIAGNOSIS — O1200 Gestational edema, unspecified trimester: Secondary | ICD-10-CM | POA: Insufficient documentation

## 2021-07-30 DIAGNOSIS — Z3493 Encounter for supervision of normal pregnancy, unspecified, third trimester: Secondary | ICD-10-CM

## 2021-07-30 NOTE — MAU Note (Signed)
Thalia Bloodgood CNM in Triage to see pt and discuss plan of care. Pt then d/c home.

## 2021-07-30 NOTE — MAU Note (Signed)
I had my feet up earlier and my R foot was tingling some. I noticed my toes and ankle on R foot were swollen. Denies VB or LOF and just having usual pregnancy discomforts. Good FM

## 2021-07-30 NOTE — MAU Provider Note (Signed)
Event Date/Time   First Provider Initiated Contact with Patient 07/30/21 0050      S Ms. Marissa Chavez is a 20 y.o. G1P0000 patient who presents to MAU today with complaint of bilateral lower extremity edema. This is a recurrent problem which typically resolves when patient elevates her feet. This evening she noted new onset tingling in her toes. She denies unilateral swelling, calf pain, calf discoloration, weakness, syncope. She also denies abdominal pain, vaginal bleeding, leaking of fluid, decreased fetal movement, fever, falls, or recent illness.    O BP 126/69   Pulse 90   Temp 98.1 F (36.7 C)   Resp 16   Ht 5\' 7"  (1.702 m)   Wt 69.9 kg   LMP 12/09/2020 (Approximate)   SpO2 100%   BMI 24.12 kg/m   \ Physical Exam Vitals and nursing note reviewed. Exam conducted with a chaperone present.  Constitutional:      Appearance: Normal appearance.  Cardiovascular:     Rate and Rhythm: Normal rate.     Pulses: Normal pulses.     Comments: Bilateral lower extremity swelling at level of malleolus. Skin noted to have indentations from very tight socks. Discussed expectations and interventions for third trimester dependent edema. Pulmonary:     Effort: Pulmonary effort is normal.  Abdominal:     Comments: Gravid  Musculoskeletal:     Right lower leg: 2+ Edema present.     Left lower leg: 2+ Edema present.  Skin:    Capillary Refill: Capillary refill takes less than 2 seconds. < 3 sec cap refill bilaterally in lower extremities Neurological:     Mental Status: She is alert and oriented to person, place, and time.  Psychiatric:        Mood and Affect: Mood normal.        Behavior: Behavior normal.        Thought Content: Thought content normal.        Judgment: Judgment normal.    A Medical screening exam complete Bilateral lower extremity edema, +2, WNL for gestational age 48 86 by Doppler  P Discharge from MAU in stable condition Warning signs for worsening  condition that would warrant emergency follow-up discussed Patient may return to MAU as needed   129, Calvert Cantor 07/30/2021 2:33 AM

## 2021-08-03 ENCOUNTER — Encounter: Payer: Self-pay | Admitting: *Deleted

## 2021-08-03 ENCOUNTER — Ambulatory Visit: Payer: Medicaid Other | Attending: Obstetrics and Gynecology

## 2021-08-03 ENCOUNTER — Ambulatory Visit: Payer: Medicaid Other | Admitting: *Deleted

## 2021-08-03 ENCOUNTER — Other Ambulatory Visit: Payer: Self-pay

## 2021-08-03 VITALS — BP 135/68 | HR 81

## 2021-08-03 DIAGNOSIS — O0990 Supervision of high risk pregnancy, unspecified, unspecified trimester: Secondary | ICD-10-CM | POA: Insufficient documentation

## 2021-08-03 DIAGNOSIS — O98513 Other viral diseases complicating pregnancy, third trimester: Secondary | ICD-10-CM | POA: Insufficient documentation

## 2021-08-03 DIAGNOSIS — O99012 Anemia complicating pregnancy, second trimester: Secondary | ICD-10-CM

## 2021-08-03 DIAGNOSIS — E669 Obesity, unspecified: Secondary | ICD-10-CM

## 2021-08-03 DIAGNOSIS — U071 COVID-19: Secondary | ICD-10-CM | POA: Diagnosis present

## 2021-08-03 DIAGNOSIS — O24419 Gestational diabetes mellitus in pregnancy, unspecified control: Secondary | ICD-10-CM | POA: Diagnosis present

## 2021-08-03 DIAGNOSIS — O99019 Anemia complicating pregnancy, unspecified trimester: Secondary | ICD-10-CM | POA: Diagnosis present

## 2021-08-03 DIAGNOSIS — Z3A36 36 weeks gestation of pregnancy: Secondary | ICD-10-CM | POA: Diagnosis not present

## 2021-08-05 ENCOUNTER — Ambulatory Visit (INDEPENDENT_AMBULATORY_CARE_PROVIDER_SITE_OTHER): Payer: Medicaid Other | Admitting: Obstetrics and Gynecology

## 2021-08-05 ENCOUNTER — Other Ambulatory Visit (HOSPITAL_COMMUNITY)
Admission: RE | Admit: 2021-08-05 | Discharge: 2021-08-05 | Disposition: A | Discharge status: Home / Self Care | Payer: Medicaid Other | Source: Ambulatory Visit | Attending: Obstetrics and Gynecology | Admitting: Obstetrics and Gynecology

## 2021-08-05 ENCOUNTER — Other Ambulatory Visit: Payer: Self-pay

## 2021-08-05 VITALS — BP 125/77 | HR 80 | Wt 154.3 lb

## 2021-08-05 DIAGNOSIS — U071 COVID-19: Secondary | ICD-10-CM

## 2021-08-05 DIAGNOSIS — O99019 Anemia complicating pregnancy, unspecified trimester: Secondary | ICD-10-CM

## 2021-08-05 DIAGNOSIS — O0990 Supervision of high risk pregnancy, unspecified, unspecified trimester: Secondary | ICD-10-CM

## 2021-08-05 DIAGNOSIS — D563 Thalassemia minor: Secondary | ICD-10-CM

## 2021-08-05 DIAGNOSIS — Z3A37 37 weeks gestation of pregnancy: Secondary | ICD-10-CM

## 2021-08-05 DIAGNOSIS — O26893 Other specified pregnancy related conditions, third trimester: Secondary | ICD-10-CM

## 2021-08-05 DIAGNOSIS — O98513 Other viral diseases complicating pregnancy, third trimester: Secondary | ICD-10-CM

## 2021-08-05 DIAGNOSIS — N898 Other specified noninflammatory disorders of vagina: Secondary | ICD-10-CM

## 2021-08-05 DIAGNOSIS — O24410 Gestational diabetes mellitus in pregnancy, diet controlled: Secondary | ICD-10-CM

## 2021-08-05 NOTE — Progress Notes (Signed)
   PRENATAL VISIT NOTE  Subjective:  Marissa Chavez is a 20 y.o. G1P0000 at [redacted]w[redacted]d being seen today for ongoing prenatal care.  She is currently monitored for the following issues for this high-risk pregnancy and has Supervision of high risk pregnancy, antepartum; Subchorionic hemorrhage; Alpha thalassemia silent carrier; COVID-19 affecting pregnancy in third trimester; Anemia during pregnancy; and Gestational diabetes mellitus (GDM), antepartum on their problem list.   Patient reports  vag d/c when she woke up this morning and got up but none since .  Contractions: Irritability. Vag. Bleeding: Small.  Movement: Present. Denies leaking of fluid.   The following portions of the patient's history were reviewed and updated as appropriate: allergies, current medications, past family history, past medical history, past social history, past surgical history and problem list.   Objective:   Vitals:   08/05/21 1059  BP: 125/77  Pulse: 80  Weight: 154 lb 4.8 oz (70 kg)    Fetal Status: Fetal Heart Rate (bpm): 140   Movement: Present  Presentation: Vertex  General:  Alert, oriented and cooperative. Patient is in no acute distress.  Skin: Skin is warm and dry. No rash noted.   Cardiovascular: Normal heart rate noted  Respiratory: Normal respiratory effort, no problems with respiration noted  Abdomen: Soft, gravid, appropriate for gestational age.  Pain/Pressure: Present     Pelvic: Cervical exam performed in the presence of a chaperone Dilation: 1 Effacement (%): 50 Station: -3 EGBUS normal Vaginal vault: normal white d/c in vault Cervix visually closed  Extremities: Normal range of motion.  Edema: None  Mental Status: Normal mood and affect. Normal behavior. Normal judgment and thought content.   Assessment and Plan:  Pregnancy: G1P0000 at [redacted]w[redacted]d 1. Alpha thalassemia silent carrier  2. [redacted] weeks gestation of pregnancy Set up IOL nv around [facility] - Strep Gp B NAA - Cervicovaginal ancillary  only( [place])  3. Vaginal discharge during pregnancy in third trimester Normal. F/u swab  4. Diet controlled gestational diabetes mellitus (GDM), antepartum Normal CBG log 9/6: ceph, afi 17, bpp 8/8, efw 86%, 2418gm, ac 99%. Rpt PRN  5. Supervision of high risk pregnancy, antepartum   Term labor symptoms and general obstetric precautions including but not limited to vaginal bleeding, contractions, leaking of fluid and fetal movement were reviewed in detail with the patient. Please refer to After Visit Summary for other counseling recommendations.   Return in about 1 week (around 08/12/2021) for low risk ob, in person, md or app.  Future Appointments  Date Time Provider Department Center  08/11/2021  9:35 AM Bernerd Limbo, CNM [facility]    Geyser Bing, MD

## 2021-08-06 LAB — CERVICOVAGINAL ANCILLARY ONLY
Bacterial Vaginitis (gardnerella): NEGATIVE
Candida Glabrata: NEGATIVE
Candida Vaginitis: POSITIVE — AB
Chlamydia: NEGATIVE
Comment: NEGATIVE
Comment: NEGATIVE
Comment: NEGATIVE
Comment: NEGATIVE
Comment: NEGATIVE
Comment: NORMAL
Neisseria Gonorrhea: NEGATIVE
Trichomonas: NEGATIVE

## 2021-08-07 LAB — STREP GP B NAA: Strep Gp B NAA: POSITIVE — AB

## 2021-08-08 ENCOUNTER — Encounter: Payer: Self-pay | Admitting: Obstetrics and Gynecology

## 2021-08-08 DIAGNOSIS — O99820 Streptococcus B carrier state complicating pregnancy: Secondary | ICD-10-CM | POA: Insufficient documentation

## 2021-08-08 MED ORDER — MICONAZOLE NITRATE 2 % VA CREA: 1.0000 | TOPICAL_CREAM | Freq: Every day | VAGINAL | 2 refills | Status: DC

## 2021-08-08 NOTE — Addendum Note (Signed)
Addended by: Sherman Bing on: 08/08/2021 09:45 PM   Modules accepted: Orders

## 2021-08-11 ENCOUNTER — Other Ambulatory Visit: Payer: Self-pay

## 2021-08-11 ENCOUNTER — Ambulatory Visit (INDEPENDENT_AMBULATORY_CARE_PROVIDER_SITE_OTHER): Payer: Medicaid Other | Admitting: Certified Nurse Midwife

## 2021-08-11 VITALS — BP 126/83 | HR 84 | Wt 156.4 lb

## 2021-08-11 DIAGNOSIS — Z3403 Encounter for supervision of normal first pregnancy, third trimester: Secondary | ICD-10-CM

## 2021-08-11 DIAGNOSIS — O2686 Pruritic urticarial papules and plaques of pregnancy (PUPPP): Secondary | ICD-10-CM

## 2021-08-11 DIAGNOSIS — O24410 Gestational diabetes mellitus in pregnancy, diet controlled: Secondary | ICD-10-CM

## 2021-08-11 DIAGNOSIS — Z3A38 38 weeks gestation of pregnancy: Secondary | ICD-10-CM

## 2021-08-11 NOTE — Progress Notes (Signed)
Patient stated that she has "period like cramps" everyday. Each "cramp" lasts for about 45 seconds to 1 minute

## 2021-08-12 MED ORDER — HYDROCORTISONE 2.5 % EX KIT: 1.0000 "application " | PACK | Freq: Two times a day (BID) | CUTANEOUS | 1 refills | Status: DC

## 2021-08-12 NOTE — Progress Notes (Signed)
   PRENATAL VISIT NOTE  Subjective:  Marissa Chavez is a 20 y.o. G1P0000 at [redacted]w[redacted]d being seen today for ongoing prenatal care.  She is currently monitored for the following issues for this high-risk pregnancy and has Supervision of high risk pregnancy, antepartum; Subchorionic hemorrhage; Alpha thalassemia silent carrier; COVID-19 affecting pregnancy in third trimester; Anemia during pregnancy; Gestational diabetes mellitus (GDM), antepartum; and GBS (group B Streptococcus carrier), +RV culture, currently pregnant on their problem list.  Patient reports occasional contractions. Asked about having induction scheduled today, heard Dr. Vergie Living say she should be scheduled after her visit today because of the increased risk of GDM complications/stillbirth. Also reports itchiness on her belly, has tried cocoa butter and coconut oil but neither are taking the itch completely away. Contractions: Irritability. Vag. Bleeding: None.  Movement: Present. Denies leaking of fluid.   The following portions of the patient's history were reviewed and updated as appropriate: allergies, current medications, past family history, past medical history, past social history, past surgical history and problem list.   Objective:   Vitals:   08/11/21 0954  BP: 126/83  Pulse: 84  Weight: 156 lb 6.4 oz (70.9 kg)    Fetal Status: Fetal Heart Rate (bpm): 135 Fundal Height: 38 cm Movement: Present  Presentation: Vertex  General:  Alert, oriented and cooperative. Patient is in no acute distress.  Skin: Skin is warm and dry. No rash noted.   Cardiovascular: Normal heart rate noted  Respiratory: Normal respiratory effort, no problems with respiration noted  Abdomen: Soft, gravid, appropriate for gestational age.  Reddened, raised plaques across abdomen, originating from around belly button and moving outward. Two areas of excoriation from nighttime scratching.Pain/Pressure: Present     Pelvic: Cervical exam deferred  Dilation: 1 Effacement (%): 50 Station: -2  Extremities: Normal range of motion.  Edema: None  Mental Status: Normal mood and affect. Normal behavior. Normal judgment and thought content.   Assessment and Plan:  Pregnancy: G1P0000 at [redacted]w[redacted]d 1. Supervision of low-risk first pregnancy, third trimester - Doing well, feeling regular and vigorous fetal movement  2. [redacted] weeks gestation of pregnancy - Routine OB care   3. Diet controlled gestational diabetes in the third trimester - Glocose well controlled per pt - forgot log, but all fastings in the 70s, post-prandials in the 90s. Plans to upload picture when she gets home. - Clarified that per Dr. Vergie Living note, he advised IOL at 40wks. Reassured patient that with well controlled blood sugar, the risks of GDM are greatly reduced. Pt expressed understanding and relief. Will schedule term IOL at next visit.  4. PUPP rash - Hydrocortisone cream sent to pharmacy  Term labor symptoms and general obstetric precautions including but not limited to vaginal bleeding, contractions, leaking of fluid and fetal movement were reviewed in detail with the patient. Please refer to After Visit Summary for other counseling recommendations.   Future Appointments  Date Time Provider Department Center  08/20/2021 10:15 AM Bernerd Limbo, CNM [facility]    Bernerd Limbo, CNM

## 2021-08-15 ENCOUNTER — Inpatient Hospital Stay (HOSPITAL_COMMUNITY)
Admission: AD | Admit: 2021-08-15 | Discharge: 2021-08-15 | Disposition: A | Discharge status: Home / Self Care | Payer: Medicaid Other | Attending: Obstetrics & Gynecology | Admitting: Obstetrics & Gynecology

## 2021-08-15 ENCOUNTER — Encounter (HOSPITAL_COMMUNITY): Payer: Self-pay | Admitting: Obstetrics & Gynecology

## 2021-08-15 ENCOUNTER — Other Ambulatory Visit: Payer: Self-pay

## 2021-08-15 ENCOUNTER — Inpatient Hospital Stay (HOSPITAL_BASED_OUTPATIENT_CLINIC_OR_DEPARTMENT_OTHER): Payer: Medicaid Other

## 2021-08-15 DIAGNOSIS — O99019 Anemia complicating pregnancy, unspecified trimester: Secondary | ICD-10-CM

## 2021-08-15 DIAGNOSIS — O368190 Decreased fetal movements, unspecified trimester, not applicable or unspecified: Secondary | ICD-10-CM

## 2021-08-15 DIAGNOSIS — O24419 Gestational diabetes mellitus in pregnancy, unspecified control: Secondary | ICD-10-CM | POA: Diagnosis not present

## 2021-08-15 DIAGNOSIS — O24410 Gestational diabetes mellitus in pregnancy, diet controlled: Secondary | ICD-10-CM | POA: Diagnosis not present

## 2021-08-15 DIAGNOSIS — Z3A38 38 weeks gestation of pregnancy: Secondary | ICD-10-CM | POA: Diagnosis not present

## 2021-08-15 DIAGNOSIS — O479 False labor, unspecified: Secondary | ICD-10-CM

## 2021-08-15 DIAGNOSIS — O471 False labor at or after 37 completed weeks of gestation: Secondary | ICD-10-CM

## 2021-08-15 DIAGNOSIS — O368130 Decreased fetal movements, third trimester, not applicable or unspecified: Secondary | ICD-10-CM

## 2021-08-15 DIAGNOSIS — U071 COVID-19: Secondary | ICD-10-CM

## 2021-08-15 DIAGNOSIS — O98513 Other viral diseases complicating pregnancy, third trimester: Secondary | ICD-10-CM

## 2021-08-15 DIAGNOSIS — O0990 Supervision of high risk pregnancy, unspecified, unspecified trimester: Secondary | ICD-10-CM

## 2021-08-15 DIAGNOSIS — Z3689 Encounter for other specified antenatal screening: Secondary | ICD-10-CM

## 2021-08-15 HISTORY — DX: Anemia, unspecified: D64.9

## 2021-08-15 HISTORY — DX: Pruritic urticarial papules and plaques of pregnancy (puppp): O26.86

## 2021-08-15 HISTORY — DX: Gestational diabetes mellitus in pregnancy, unspecified control: O24.419

## 2021-08-15 LAB — URINALYSIS, ROUTINE W REFLEX MICROSCOPIC
Bilirubin Urine: NEGATIVE
Glucose, UA: NEGATIVE mg/dL
Hgb urine dipstick: NEGATIVE
Ketones, ur: NEGATIVE mg/dL
Leukocytes,Ua: NEGATIVE
Nitrite: NEGATIVE
Protein, ur: NEGATIVE mg/dL
Specific Gravity, Urine: <1.005 — ABNORMAL LOW (ref 1.005–1.030)
pH: 6 (ref 5.0–8.0)

## 2021-08-15 NOTE — MAU Note (Signed)
To [place] for BPP. FHR category 1.

## 2021-08-15 NOTE — MAU Provider Note (Signed)
History     CSN: [id number]  Arrival date and time: 08/15/21 1951   Event Date/Time   First Provider Initiated Contact with Patient 08/15/21 2034      Chief Complaint  Patient presents with   Decreased Fetal Movement   20 y.o. G1 @38 .4 wks presenting with decreased FM and cramping. Reports less frequent and less strong FM x2 days. Reports intermittent low abdominal cramping 5-6 times an hour over the last week. At times she has sharp pain into her vagina. Denies VB or LOF. Denies urinary sx.    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)     Past Surgical History:  Procedure Laterality Date   NO PAST SURGERIES      Family History  Problem Relation Age of Onset   Hypertension Mother    Depression Father    Hypertension Maternal Grandfather    Hypertension Paternal Grandmother    Kidney disease Paternal Grandmother     Social History   Tobacco Use   Smoking status: Never   Smokeless tobacco: Never  Vaping Use   Vaping Use: Never used  Substance Use Topics   Alcohol use: Not Currently   Drug use: Not Currently    Allergies: No Known Allergies  Medications Prior to Admission  Medication Sig Dispense Refill Last Dose   ferrous sulfate (FERROUSUL) 325 (65 FE) MG tablet Take 1 tablet (325 mg total) by mouth every other day. 30 tablet 2 Past Week   Hydrocortisone 2.5 % KIT Apply 1 application topically in the morning and at bedtime. 1 kit 1 08/15/2021   Prenatal Vit-Fe Fumarate-FA (MULTIVITAMIN-PRENATAL) 27-0.8 MG TABS tablet Take 1 tablet by mouth daily at 12 noon.   Past Week   Accu-Chek Softclix Lancets lancets 120 each by Other route 4 (four) times daily. 100 each 12    Blood Glucose  Monitoring Suppl (ACCU-CHEK NANO SMARTVIEW) w/Device KIT 1 kit by Subdermal route as directed. Check blood sugars for fasting, and two hours after breakfast, lunch and dinner (4 checks daily) 1 kit 0    glucose blood test strip \Use four times daily 100 each 12    miconazole (MONISTAT 7) 2 % vaginal cream Place 1 Applicatorful vaginally at bedtime. Apply for seven nights (Patient not taking: Reported on 08/11/2021) 30 g 2     Review of Systems  Gastrointestinal:  Positive for abdominal pain.  Genitourinary:  Negative for dysuria, hematuria, urgency, vaginal bleeding and vaginal discharge.  Physical Exam   Blood pressure 132/83, pulse 79, temperature 97.9 F (36.6 C), temperature source Oral, resp. rate 16, height 5' 7"  (1.702 m), weight 70.8 kg, last menstrual period 12/09/2020, SpO2 99 %.  Physical Exam Vitals and nursing note reviewed.  Constitutional:      General: She is not in acute distress.    Appearance: Normal appearance.  HENT:     Head: Normocephalic and atraumatic.  Cardiovascular:     Rate and Rhythm: Normal  rate.  Pulmonary:     Effort: Pulmonary effort is normal. No respiratory distress.  Genitourinary:    Comments: VE: closed/thick Musculoskeletal:        General: Normal range of motion.     Cervical back: Normal range of motion.  Skin:    General: Skin is warm and dry.  Neurological:     General: No focal deficit present.     Mental Status: She is alert and oriented to person, place, and time.  Psychiatric:        Mood and Affect: Mood normal.        Behavior: Behavior normal.  EFM: 135 bpm, mod variability, + accels, no decels Toco: irregular  No results found for this or any previous visit (from the past 24 hour(s)).  MAU Course  Procedures  MDM Labs and BPP ordered. No signs of labor. BPP 8/8 w/reactive NST>10/10. Pt reports more FM since EFM applied. Greater than 10 FM marked in the first hr. Pt reassured. Stable for discharge home.   Assessment and  Plan  [redacted] weeks gestation Reactive NST False labor Discharge home Follow up at [facility] as scheduled [facility] Labor precautions  Allergies as of 08/15/2021   No Known Allergies      Medication List     STOP taking these medications    miconazole 2 % vaginal cream Commonly known as: MONISTAT 7       TAKE these medications    Accu-Chek Nano SmartView w/Device Kit 1 kit by Subdermal route as directed. Check blood sugars for fasting, and two hours after breakfast, lunch and dinner (4 checks daily)   Accu-Chek Softclix Lancets lancets 120 each by Other route 4 (four) times daily.   ferrous sulfate 325 (65 FE) MG tablet Commonly known as: FerrouSul Take 1 tablet (325 mg total) by mouth every other day.   glucose blood test strip \Use four times daily   Hydrocortisone 2.5 % Kit Apply 1 application topically in the morning and at bedtime.   multivitamin-prenatal 27-0.8 MG Tabs tablet Take 1 tablet by mouth daily at 12 noon.        Julianne Handler, CNM 08/15/2021, 10:11 PM

## 2021-08-15 NOTE — MAU Note (Addendum)
Contractions and cramps, tightning more often over the weekend.  Hasn't been timing them.  Decreased fetal movement, not moving as often, moved in triage room, just not as often as usual.  No bleeding. Lost mucus plug today.  Cervix was finger tip at last visit.  No leaking. Gestational DM, diet controlled. GBS positive

## 2021-08-18 ENCOUNTER — Inpatient Hospital Stay (HOSPITAL_COMMUNITY): Payer: Medicaid Other | Admitting: Anesthesiology

## 2021-08-18 ENCOUNTER — Inpatient Hospital Stay (HOSPITAL_COMMUNITY)
Admission: AD | Admit: 2021-08-18 | Discharge: 2021-08-20 | DRG: 807 | Disposition: A | Discharge status: Home / Self Care | Payer: Medicaid Other | Attending: Obstetrics and Gynecology | Admitting: Obstetrics and Gynecology

## 2021-08-18 ENCOUNTER — Encounter (HOSPITAL_COMMUNITY): Payer: Self-pay | Admitting: Obstetrics and Gynecology

## 2021-08-18 ENCOUNTER — Other Ambulatory Visit: Payer: Self-pay

## 2021-08-18 DIAGNOSIS — O99824 Streptococcus B carrier state complicating childbirth: Secondary | ICD-10-CM | POA: Diagnosis present

## 2021-08-18 DIAGNOSIS — Z8616 Personal history of COVID-19: Secondary | ICD-10-CM | POA: Diagnosis not present

## 2021-08-18 DIAGNOSIS — O0990 Supervision of high risk pregnancy, unspecified, unspecified trimester: Secondary | ICD-10-CM

## 2021-08-18 DIAGNOSIS — O134 Gestational [pregnancy-induced] hypertension without significant proteinuria, complicating childbirth: Principal | ICD-10-CM | POA: Diagnosis present

## 2021-08-18 DIAGNOSIS — O139 Gestational [pregnancy-induced] hypertension without significant proteinuria, unspecified trimester: Secondary | ICD-10-CM | POA: Diagnosis present

## 2021-08-18 DIAGNOSIS — O99019 Anemia complicating pregnancy, unspecified trimester: Secondary | ICD-10-CM | POA: Diagnosis present

## 2021-08-18 DIAGNOSIS — Z3A39 39 weeks gestation of pregnancy: Secondary | ICD-10-CM | POA: Diagnosis not present

## 2021-08-18 DIAGNOSIS — D563 Thalassemia minor: Secondary | ICD-10-CM | POA: Diagnosis present

## 2021-08-18 DIAGNOSIS — O99820 Streptococcus B carrier state complicating pregnancy: Secondary | ICD-10-CM

## 2021-08-18 DIAGNOSIS — U071 COVID-19: Secondary | ICD-10-CM | POA: Diagnosis present

## 2021-08-18 DIAGNOSIS — O24410 Gestational diabetes mellitus in pregnancy, diet controlled: Secondary | ICD-10-CM

## 2021-08-18 DIAGNOSIS — O9902 Anemia complicating childbirth: Secondary | ICD-10-CM | POA: Diagnosis present

## 2021-08-18 DIAGNOSIS — Z20822 Contact with and (suspected) exposure to covid-19: Secondary | ICD-10-CM | POA: Diagnosis present

## 2021-08-18 DIAGNOSIS — O24420 Gestational diabetes mellitus in childbirth, diet controlled: Secondary | ICD-10-CM | POA: Diagnosis present

## 2021-08-18 DIAGNOSIS — O4593 Premature separation of placenta, unspecified, third trimester: Secondary | ICD-10-CM | POA: Diagnosis present

## 2021-08-18 DIAGNOSIS — O26893 Other specified pregnancy related conditions, third trimester: Secondary | ICD-10-CM | POA: Diagnosis present

## 2021-08-18 DIAGNOSIS — O98513 Other viral diseases complicating pregnancy, third trimester: Secondary | ICD-10-CM

## 2021-08-18 DIAGNOSIS — O24419 Gestational diabetes mellitus in pregnancy, unspecified control: Secondary | ICD-10-CM | POA: Diagnosis present

## 2021-08-18 LAB — COMPREHENSIVE METABOLIC PANEL
ALT: 18 U/L (ref 0–44)
AST: 27 U/L (ref 15–41)
Albumin: 2.5 g/dL — ABNORMAL LOW (ref 3.5–5.0)
Alkaline Phosphatase: 235 U/L — ABNORMAL HIGH (ref 38–126)
Anion gap: 6 (ref 5–15)
BUN: 6 mg/dL (ref 6–20)
CO2: 22 mmol/L (ref 22–32)
Calcium: 8.6 mg/dL — ABNORMAL LOW (ref 8.9–10.3)
Chloride: 107 mmol/L (ref 98–111)
Creatinine, Ser: 0.76 mg/dL (ref 0.44–1.00)
GFR, Estimated: >60 mL/min (ref >60)
Glucose, Bld: 102 mg/dL — ABNORMAL HIGH (ref 70–99)
Potassium: 3.7 mmol/L (ref 3.5–5.1)
Sodium: 135 mmol/L (ref 135–145)
Total Bilirubin: 0.9 mg/dL (ref 0.3–1.2)
Total Protein: 5.4 g/dL — ABNORMAL LOW (ref 6.5–8.1)

## 2021-08-18 LAB — CBC
HCT: 35.3 % — ABNORMAL LOW (ref 36.0–46.0)
HCT: 31.8 % — ABNORMAL LOW (ref 36.0–46.0)
Hemoglobin: 11.5 g/dL — ABNORMAL LOW (ref 12.0–15.0)
Hemoglobin: 10.2 g/dL — ABNORMAL LOW (ref 12.0–15.0)
MCH: 29 pg (ref 26.0–34.0)
MCH: 29 pg (ref 26.0–34.0)
MCHC: 32.6 g/dL (ref 30.0–36.0)
MCHC: 32.1 g/dL (ref 30.0–36.0)
MCV: 89.1 fL (ref 80.0–100.0)
MCV: 90.3 fL (ref 80.0–100.0)
Platelets: 201 10*3/uL (ref 150–400)
Platelets: 153 10*3/uL (ref 150–400)
RBC: 3.96 MIL/uL (ref 3.87–5.11)
RBC: 3.52 MIL/uL — ABNORMAL LOW (ref 3.87–5.11)
RDW: 14.1 % (ref 11.5–15.5)
RDW: 14.2 % (ref 11.5–15.5)
WBC: 12.4 10*3/uL — ABNORMAL HIGH (ref 4.0–10.5)
WBC: 14.3 10*3/uL — ABNORMAL HIGH (ref 4.0–10.5)
nRBC: 0 % (ref 0.0–0.2)
nRBC: 0 % (ref 0.0–0.2)

## 2021-08-18 LAB — RESP PANEL BY RT-PCR (FLU A&B, COVID) ARPGX2
Influenza A by PCR: NEGATIVE
Influenza B by PCR: NEGATIVE
SARS Coronavirus 2 by RT PCR: NEGATIVE

## 2021-08-18 LAB — TYPE AND SCREEN
ABO/RH(D): O POS
Antibody Screen: NEGATIVE

## 2021-08-18 LAB — GLUCOSE, CAPILLARY: Glucose-Capillary: 86 mg/dL (ref 70–99)

## 2021-08-18 LAB — RPR: RPR Ser Ql: NONREACTIVE

## 2021-08-18 MED ORDER — ACETAMINOPHEN 325 MG PO TABS: 650.0000 mg | ORAL_TABLET | ORAL | Status: DC | PRN

## 2021-08-18 MED ORDER — WITCH HAZEL-GLYCERIN EX PADS: 1.0000 "application " | MEDICATED_PAD | CUTANEOUS | Status: DC | PRN

## 2021-08-18 MED ORDER — OXYCODONE-ACETAMINOPHEN 5-325 MG PO TABS: 1.0000 | ORAL_TABLET | ORAL | Status: DC | PRN

## 2021-08-18 MED ORDER — TETANUS-DIPHTH-ACELL PERTUSSIS 5-2.5-18.5 LF-MCG/0.5 IM SUSY
0.5000 mL | PREFILLED_SYRINGE | Freq: Once | INTRAMUSCULAR | Status: DC
Start: 2021-08-19 — End: 2021-08-20

## 2021-08-18 MED ORDER — FENTANYL CITRATE (PF) 100 MCG/2ML IJ SOLN: 50.0000 ug | INTRAMUSCULAR | Status: DC | PRN

## 2021-08-18 MED ORDER — FENTANYL-BUPIVACAINE-NACL 0.5-0.125-0.9 MG/250ML-% EP SOLN: 12.0000 mL/h | EPIDURAL | Status: DC | PRN

## 2021-08-18 MED ORDER — ONDANSETRON HCL 4 MG PO TABS: 4.0000 mg | ORAL_TABLET | ORAL | Status: DC | PRN

## 2021-08-18 MED ORDER — FENTANYL CITRATE (PF) 100 MCG/2ML IJ SOLN: 100.0000 ug | INTRAMUSCULAR | Status: DC | PRN

## 2021-08-18 MED ORDER — FENTANYL-BUPIVACAINE-NACL 0.5-0.125-0.9 MG/250ML-% EP SOLN
EPIDURAL | Status: AC
  Filled 2021-08-18: qty 250

## 2021-08-18 MED ORDER — OXYCODONE-ACETAMINOPHEN 5-325 MG PO TABS: 2.0000 | ORAL_TABLET | ORAL | Status: DC | PRN

## 2021-08-18 MED ORDER — BENZOCAINE-MENTHOL 20-0.5 % EX AERO
1.0000 "application " | INHALATION_SPRAY | CUTANEOUS | Status: DC | PRN
  Administered 2021-08-18: 1 via TOPICAL
  Filled 2021-08-18: qty 56

## 2021-08-18 MED ORDER — EPHEDRINE 5 MG/ML INJ
10.0000 mg | INTRAVENOUS | Status: DC | PRN
Start: 2021-08-18 — End: 2021-08-18

## 2021-08-18 MED ORDER — DIPHENHYDRAMINE HCL 50 MG/ML IJ SOLN: 12.5000 mg | INTRAMUSCULAR | Status: DC | PRN

## 2021-08-18 MED ORDER — COCONUT OIL OIL: 1.0000 "application " | TOPICAL_OIL | Status: DC | PRN

## 2021-08-18 MED ORDER — SOD CITRATE-CITRIC ACID 500-334 MG/5ML PO SOLN: 30.0000 mL | ORAL | Status: DC | PRN

## 2021-08-18 MED ORDER — ZOLPIDEM TARTRATE 5 MG PO TABS: 5.0000 mg | ORAL_TABLET | Freq: Every evening | ORAL | Status: DC | PRN

## 2021-08-18 MED ORDER — PHENYLEPHRINE 40 MCG/ML (10ML) SYRINGE FOR IV PUSH (FOR BLOOD PRESSURE SUPPORT)
PREFILLED_SYRINGE | INTRAVENOUS | Status: AC
  Administered 2021-08-18: 80 ug via INTRAVENOUS
  Filled 2021-08-18: qty 10

## 2021-08-18 MED ORDER — MISOPROSTOL 200 MCG PO TABS
ORAL_TABLET | ORAL | Status: AC
  Filled 2021-08-18: qty 4

## 2021-08-18 MED ORDER — LIDOCAINE HCL (PF) 1 % IJ SOLN
INTRAMUSCULAR | Status: DC | PRN
  Administered 2021-08-18 (×2): 5 mL via EPIDURAL

## 2021-08-18 MED ORDER — DIPHENHYDRAMINE HCL 25 MG PO CAPS: 25.0000 mg | ORAL_CAPSULE | Freq: Four times a day (QID) | ORAL | Status: DC | PRN

## 2021-08-18 MED ORDER — LACTATED RINGERS IV SOLN
500.0000 mL | INTRAVENOUS | Status: DC | PRN
  Administered 2021-08-18: 500 mL via INTRAVENOUS

## 2021-08-18 MED ORDER — NIFEDIPINE ER OSMOTIC RELEASE 30 MG PO TB24
30.0000 mg | ORAL_TABLET | Freq: Every day | ORAL | Status: DC
  Administered 2021-08-18 – 2021-08-20 (×3): 30 mg via ORAL
  Filled 2021-08-18 (×3): qty 1

## 2021-08-18 MED ORDER — LIDOCAINE-EPINEPHRINE (PF) 1.5 %-1:200000 IJ SOLN
INTRAMUSCULAR | Status: DC | PRN
  Administered 2021-08-18: 5 mL via EPIDURAL

## 2021-08-18 MED ORDER — LIDOCAINE HCL (PF) 1 % IJ SOLN: 30.0000 mL | INTRAMUSCULAR | Status: DC | PRN

## 2021-08-18 MED ORDER — SIMETHICONE 80 MG PO CHEW: 80.0000 mg | CHEWABLE_TABLET | ORAL | Status: DC | PRN

## 2021-08-18 MED ORDER — LACTATED RINGERS IV SOLN
500.0000 mL | Freq: Once | INTRAVENOUS | Status: AC
  Administered 2021-08-18: 500 mL via INTRAVENOUS

## 2021-08-18 MED ORDER — PHENYLEPHRINE 40 MCG/ML (10ML) SYRINGE FOR IV PUSH (FOR BLOOD PRESSURE SUPPORT): 80.0000 ug | PREFILLED_SYRINGE | INTRAVENOUS | Status: DC | PRN

## 2021-08-18 MED ORDER — FENTANYL CITRATE (PF) 100 MCG/2ML IJ SOLN
100.0000 ug | Freq: Once | INTRAMUSCULAR | Status: AC
Start: 2021-08-18 — End: 2021-08-18
  Administered 2021-08-18: 100 ug via INTRAMUSCULAR
  Filled 2021-08-18: qty 2

## 2021-08-18 MED ORDER — SENNOSIDES-DOCUSATE SODIUM 8.6-50 MG PO TABS
2.0000 | ORAL_TABLET | Freq: Every day | ORAL | Status: DC
  Administered 2021-08-19 – 2021-08-20 (×2): 2 via ORAL
  Filled 2021-08-18 (×2): qty 2

## 2021-08-18 MED ORDER — SODIUM CHLORIDE 0.9 % IV SOLN: 1.0000 g | INTRAVENOUS | Status: DC

## 2021-08-18 MED ORDER — TRANEXAMIC ACID-NACL 1000-0.7 MG/100ML-% IV SOLN: 1000.0000 mg | INTRAVENOUS | Status: AC

## 2021-08-18 MED ORDER — OXYTOCIN BOLUS FROM INFUSION
333.0000 mL | Freq: Once | INTRAVENOUS | Status: AC
  Administered 2021-08-18: 333 mL via INTRAVENOUS

## 2021-08-18 MED ORDER — EPHEDRINE 5 MG/ML INJ: 10.0000 mg | INTRAVENOUS | Status: DC | PRN

## 2021-08-18 MED ORDER — DIBUCAINE (PERIANAL) 1 % EX OINT: 1.0000 "application " | TOPICAL_OINTMENT | CUTANEOUS | Status: DC | PRN

## 2021-08-18 MED ORDER — TERBUTALINE SULFATE 1 MG/ML IJ SOLN
INTRAMUSCULAR | Status: AC
  Filled 2021-08-18: qty 1

## 2021-08-18 MED ORDER — IBUPROFEN 600 MG PO TABS
600.0000 mg | ORAL_TABLET | Freq: Four times a day (QID) | ORAL | Status: DC
Start: 2021-08-18 — End: 2021-08-20
  Administered 2021-08-19 – 2021-08-20 (×8): 600 mg via ORAL
  Filled 2021-08-18 (×8): qty 1

## 2021-08-18 MED ORDER — INFLUENZA VAC SPLIT QUAD 0.5 ML IM SUSY: 0.5000 mL | PREFILLED_SYRINGE | INTRAMUSCULAR | Status: DC

## 2021-08-18 MED ORDER — SODIUM CHLORIDE 0.9 % IV SOLN
2.0000 g | Freq: Once | INTRAVENOUS | Status: AC
  Administered 2021-08-18: 2 g via INTRAVENOUS
  Filled 2021-08-18: qty 2000

## 2021-08-18 MED ORDER — ONDANSETRON HCL 4 MG/2ML IJ SOLN: 4.0000 mg | INTRAMUSCULAR | Status: DC | PRN

## 2021-08-18 MED ORDER — TRANEXAMIC ACID-NACL 1000-0.7 MG/100ML-% IV SOLN
INTRAVENOUS | Status: AC
  Administered 2021-08-18: 1000 mg via INTRAVENOUS
  Filled 2021-08-18: qty 100

## 2021-08-18 MED ORDER — ONDANSETRON HCL 4 MG/2ML IJ SOLN: 4.0000 mg | Freq: Four times a day (QID) | INTRAMUSCULAR | Status: DC | PRN

## 2021-08-18 MED ORDER — MISOPROSTOL 200 MCG PO TABS
800.0000 ug | ORAL_TABLET | Freq: Once | ORAL | Status: AC
  Administered 2021-08-18: 800 ug via RECTAL

## 2021-08-18 MED ORDER — ERYTHROMYCIN 5 MG/GM OP OINT
TOPICAL_OINTMENT | OPHTHALMIC | Status: AC
  Filled 2021-08-18: qty 1

## 2021-08-18 MED ORDER — PRENATAL MULTIVITAMIN CH
1.0000 | ORAL_TABLET | Freq: Every day | ORAL | Status: DC
  Administered 2021-08-19 – 2021-08-20 (×2): 1 via ORAL
  Filled 2021-08-18 (×2): qty 1

## 2021-08-18 MED ORDER — LACTATED RINGERS IV SOLN: INTRAVENOUS | Status: DC

## 2021-08-18 MED ORDER — OXYTOCIN-SODIUM CHLORIDE 30-0.9 UT/500ML-% IV SOLN: 2.5000 [IU]/h | INTRAVENOUS | Status: DC

## 2021-08-18 NOTE — Anesthesia Procedure Notes (Signed)
Epidural Patient location during procedure: OB Start time: 08/18/2021 10:32 AM End time: 08/18/2021 10:42 AM  Staffing Anesthesiologist: Leonides Grills, MD Performed: anesthesiologist   Preanesthetic Checklist Completed: patient identified, IV checked, site marked, risks and benefits discussed, monitors and equipment checked, pre-op evaluation and timeout performed  Epidural Patient position: sitting Prep: DuraPrep Patient monitoring: heart rate, cardiac monitor, continuous pulse ox and blood pressure Approach: midline Location: L4-L5 Injection technique: LOR air  Needle:  Needle type: Tuohy  Needle gauge: 18 G Needle length: 9 cm Needle insertion depth: 6 cm Catheter type: closed end flexible Catheter size: 20 Guage Catheter at skin depth: 11 cm Test dose: negative and Other  Assessment Events: blood not aspirated, injection not painful, no injection resistance and negative IV test  Additional Notes Informed consent obtained prior to proceeding including risk of failure, 1% risk of PDPH, risk of minor discomfort and bruising. Timeout performed pre-procedure verifying patient name, procedure, and platelet count.  Loss of resistance encountered on the second attempt. Positive heme in during catheter placement. Needle and catheter removed, and needle repositioned. No heme encountered during the second attempt at catheter placement. Patient tolerated procedure well. Reason for block:procedure for pain

## 2021-08-18 NOTE — Anesthesia Postprocedure Evaluation (Signed)
Anesthesia Post Note  Patient: Marissa Chavez  Procedure(s) Performed: AN AD HOC LABOR EPIDURAL     Patient location during evaluation: Mother Baby Anesthesia Type: Epidural Level of consciousness: awake and alert and oriented Pain management: satisfactory to patient Vital Signs Assessment: post-procedure vital signs reviewed and stable Respiratory status: respiratory function stable Cardiovascular status: stable Postop Assessment: no headache, no backache, epidural receding, patient able to bend at knees, no signs of nausea or vomiting, adequate PO intake and able to ambulate Anesthetic complications: no   No notable events documented.  Last Vitals:  Vitals:   08/18/21 1441 08/18/21 1619  BP: (!) 144/83 137/81  Pulse: 68 (!) 58  Resp: 18 18  Temp: 37.4 C 37.3 C  SpO2: 100% 99%    Last Pain:  Vitals:   08/18/21 1624  TempSrc:   PainSc: 0-No pain   Pain Goal:                   [name]

## 2021-08-18 NOTE — H&P (Signed)
OBSTETRIC ADMISSION HISTORY AND PHYSICAL  Marissa Chavez is a 20 y.o. female G1P0000 with IUP at 28w0dby 195 weekUKoreapresenting for active labor and vaginal bleeding. She reports +FMs, No LOF, no blurry vision, headaches or peripheral edema, and RUQ pain.  She plans on breast feeding. She is undecided about birth control. [facility] Dating: By early [place]--->  Estimated Date of Delivery: 08/25/21   Nursing Staff Provider  Office Location  [id number] Dating    [place] [facility]  Language  English Anatomy [place]  normal female  Flu Vaccine   Genetic/Carrier Screen  NIPS:   Low risk boy AFP:   Screen negative [place]: not done - [place] negative  TDaP Vaccine   at 28 weeks Hgb A1C or  GTT Early  Third trimester - abnormal  COVID Vaccine    LAB RESULTS   Rhogam   NA Blood Type   O pos  Baby Feeding Plan  Breast Antibody  neg  [facility]  Contraception  Undecided Rubella  immune [place]  Circumcision  Yes, IP RPR   NR  [place]  Pediatrician   List given HBsAg    Neg from [place]  Support Person  JRoderic PalauHCVAb  neg from [place]  Prenatal Classes  Info given HIV  NR  from [place]  BTL Consent  NA GBS   (For PCN allergy, check sensitivities)   VBAC Consent  NA Pap NA age       BP Cuff  Waterbirth  [ ]  Class [ ]  Consent [ ]  CNM visit  PHQ9 & GAD7 [  ] new OB [  ] 28 weeks  [  ] 36 weeks Induction  [ ]  Orders Entered [ ] Foley Y/N     Prenatal History/Complications: Anemia, GDM diet controlled  Past Medical History: Past Medical History:  Diagnosis Date   Anemia    Gestational diabetes    A1GDM   PUPP (pruritic urticarial papules and plaques of pregnancy)     Past Surgical History: Past Surgical History:  Procedure Laterality Date   NO PAST SURGERIES      Obstetrical History: OB History     Gravida  1   Para  0   Term  0   Preterm  0   AB  0   Living  0      SAB  0   IAB  0   Ectopic   0   Multiple  0   Live Births  0           Social History Social History   Socioeconomic History   Marital status: Single    Spouse name: Not on file   Number of children: Not on file   Years of education: Not on file   Highest education level: Not on file  Occupational History   Not on file  Tobacco Use   Smoking status: Never   Smokeless tobacco: Never  Vaping Use   Vaping Use: Never used  Substance and Sexual Activity   Alcohol use: Not Currently   Drug use: Not Currently   Sexual activity: Yes    Birth control/protection: None  Other Topics Concern   Not on file  Social History Narrative   Not on file   Social Determinants of Health   Financial Resource Strain: Not on file  Food Insecurity: No Food Insecurity  Worried About Charity fundraiser in the Last Year: Never true   [place] in the Last Year: Never true  Transportation Needs: No Transportation Needs   Lack of Transportation (Medical): No   Lack of Transportation (Non-Medical): No  Physical Activity: Not on file  Stress: Not on file  Social Connections: Not on file    Family History: Family History  Problem Relation Age of Onset   Hypertension Mother    Depression Father    Hypertension Maternal Grandfather    Hypertension Paternal Grandmother    Kidney disease Paternal Grandmother     Allergies: No Known Allergies  Medications Prior to Admission  Medication Sig Dispense Refill Last Dose   Accu-Chek Softclix Lancets lancets 120 each by Other route 4 (four) times daily. 100 each 12    Blood Glucose Monitoring Suppl (ACCU-CHEK NANO SMARTVIEW) w/Device KIT 1 kit by Subdermal route as directed. Check blood sugars for fasting, and two hours after breakfast, lunch and dinner (4 checks daily) 1 kit 0    ferrous sulfate (FERROUSUL) 325 (65 FE) MG tablet Take 1 tablet (325 mg total) by mouth every other day. 30 tablet 2    glucose blood test strip \Use four times daily 100 each 12     Hydrocortisone 2.5 % KIT Apply 1 application topically in the morning and at bedtime. 1 kit 1    Prenatal Vit-Fe Fumarate-FA (MULTIVITAMIN-PRENATAL) 27-0.8 MG TABS tablet Take 1 tablet by mouth daily at 12 noon.        Review of Systems   All systems reviewed and negative except as stated in HPI  Blood pressure 134/79, pulse 70, temperature 98.4 F (36.9 C), temperature source Oral, resp. rate 20, last menstrual period 12/09/2020, SpO2 100 %. General appearance: alert, cooperative, and no distress Lungs: clear to auscultation bilaterally Heart: regular rate and rhythm Abdomen: soft, non-tender; bowel sounds normal Pelvic: n/a Extremities: Homans sign is negative, no sign of DVT DTR's wnl Presentation: cephalic Fetal monitoringBaseline: 135 bpm Uterine activityFrequency: Every 1-2 minutes Dilation: 3.5 Effacement (%): 80 Station: -2 Exam by:: n druebbisch rn   Prenatal labs: ABO, Rh: --/--/PENDING (09/21 0940) Antibody: PENDING (09/21 0940) Rubella: Immune (03/14 0000) RPR: Non Reactive (07/06 0855)  HBsAg: Negative (03/14 0000)  HIV: Non Reactive (07/06 0855)  GBS: Positive/-- (09/08 1200)  2 hr Glucola abnormal, GDM --diet Genetic screening  NIPS wnl Anatomy US wnl  Prenatal Transfer Tool  Maternal Diabetes: Yes:  Diabetes Type:  Diet controlled Genetic Screening: Normal Maternal Ultrasounds/Referrals: Normal Fetal Ultrasounds or other Referrals:  None Maternal Substance Abuse:  No Significant Maternal Medications:  None Significant Maternal Lab Results: Group B Strep positive  Results for orders placed or performed during the hospital encounter of 08/18/21 (from the past 24 hour(s))  Type and screen [place]   Collection Time: 08/18/21  9:40 AM  Result Value Ref Range   ABO/RH(D) PENDING    Antibody Screen PENDING    Sample Expiration      08/21/2021,2359 Performed at [facility], [place] [address] [place], [place] [address]    Glucose, capillary   Collection Time: 08/18/21  9:54 AM  Result Value Ref Range   Glucose-Capillary 86 70 - 99 mg/dL    Patient Active Problem List   Diagnosis Date Noted   Indication for care in labor and delivery, antepartum 08/18/2021   GBS (group B Streptococcus carrier), +RV culture, currently pregnant 08/08/2021   Anemia during pregnancy  06/03/2021   Gestational diabetes mellitus (GDM), antepartum 06/03/2021   COVID-19 affecting pregnancy in third trimester 05/24/2021   Alpha thalassemia silent carrier 04/29/2021   Supervision of high risk pregnancy, antepartum 03/29/2021   Subchorionic hemorrhage 03/29/2021    Assessment/Plan:  Marissa Chavez is a 20 y.o. G1P0000 at 71w0dhere for active labor at term Vaginal bleeding  #Labor:expectant management #Pain: May have epidural when desired #FWB: Category I #ID:  GBS positive, Ampicillin ordered #MOF: breast #MOC:undecided #Circ:  Yes, IP  LFatima Blank CNM  08/18/2021, 10:04 AM

## 2021-08-18 NOTE — Anesthesia Preprocedure Evaluation (Signed)
Anesthesia Evaluation  Patient identified by MRN, date of birth, ID band Patient awake    Reviewed: Allergy & Precautions, H&P , NPO status , Patient's Chart, lab work & pertinent test results  History of Anesthesia Complications Negative for: history of anesthetic complications  Airway Mallampati: II  TM Distance: >3 FB Neck ROM: full    Dental no notable dental hx. (+) Teeth Intact   Pulmonary neg pulmonary ROS,    Pulmonary exam normal breath sounds clear to auscultation       Cardiovascular negative cardio ROS Normal cardiovascular exam Rhythm:regular Rate:Normal     Neuro/Psych negative neurological ROS  negative psych ROS   GI/Hepatic negative GI ROS, Neg liver ROS,   Endo/Other  diabetes, Gestational  Renal/GU negative Renal ROS  negative genitourinary   Musculoskeletal   Abdominal   Peds  Hematology  (+) Blood dyscrasia, anemia ,   Anesthesia Other Findings   Reproductive/Obstetrics (+) Pregnancy                             Anesthesia Physical Anesthesia Plan  ASA: 2  Anesthesia Plan: Epidural   Post-op Pain Management:    Induction:   PONV Risk Score and Plan:   Airway Management Planned:   Additional Equipment:   Intra-op Plan:   Post-operative Plan:   Informed Consent: I have reviewed the patients History and Physical, chart, labs and discussed the procedure including the risks, benefits and alternatives for the proposed anesthesia with the patient or authorized representative who has indicated his/her understanding and acceptance.       Plan Discussed with:   Anesthesia Plan Comments:         Anesthesia Quick Evaluation

## 2021-08-18 NOTE — Lactation Note (Signed)
This note was copied from a baby's chart. Lactation Consultation Note  Patient Name: Marissa Chavez [id number] Date: 08/18/2021 Reason for consult: Initial assessment;1st time breastfeeding;Term;Maternal endocrine disorder Age:21 hours Per mom, infant latched for 15 minutes in L&D, not latched well on MBU, been sleepy and reluctant to breastfeed.  Mom hx GDM in pregnancy and infant with low blood sugar at this time, had glucose gel.  Mom attempted to latch infant but infant only held breast in mouth and sleepy. LC discussed donor breast milk, mom declined, LEAD discussed and infant was given 5 mls of formula by curve tip syringe. Afterwards dad will do 1 hours of skin to skin with infant. Mom knows to breastfeed infant according to hunger cues, 8 to 12+ times within 24 hours. Mom was using the DEBP as LC left the room, mom understands to pump every 3 hours for 15 minutes on initial setting. Mom knows to call RN/LC for further latch assistance if needed. Mom choose to supplement infant at this time, if infant starts latching well and glucose level is normal she plans to stop formula and focus exclusively on breastfeeding infant. Mom knows to give infant back any EBM from hand expression or when using the DEBP. Mom shown how to use DEBP & how to disassemble, clean, & reassemble parts.  Mom made aware of O/P services, breastfeeding support groups, community resources, and our phone # for post-discharge questions.    Maternal Data Has patient been taught Hand Expression?: Yes Does the patient have breastfeeding experience prior to this delivery?: No  Feeding Mother's Current Feeding Choice: Breast Milk and Formula  LATCH Score Latch: Too sleepy or reluctant, no latch achieved, no sucking elicited.  Audible Swallowing: None  Type of Nipple: Everted at rest and after stimulation  Comfort (Breast/Nipple): Soft / non-tender  Hold (Positioning): Assistance needed to correctly position  infant at breast and maintain latch.  LATCH Score: 5   Lactation Tools Discussed/Used Tools: Pump;Flanges Flange Size: 27 Breast pump type: Double-Electric Breast Pump Pump Education: Setup, frequency, and cleaning;Milk Storage Reason for Pumping: Infant not latching well at breast , has hypoglycemia, sleepy Pumping frequency: Mom will pump every 3 hours for 15 minutes on inital setting.  Interventions Interventions: Breast feeding basics reviewed;Assisted with latch;Skin to skin;Adjust position;Support pillows;Position options;Breast compression;Hand express;Expressed milk;Pre-pump if needed;Education;DEBP;Hand pump  Discharge Pump: Personal;Manual;DEBP WIC Program: Yes  Consult Status Consult Status: Follow-up Date: 08/19/21 Follow-up type: In-patient    Danelle Earthly 08/18/2021, 7:54 PM

## 2021-08-18 NOTE — MAU Note (Signed)
Marissa Chavez is a 20 y.o. at [redacted]w[redacted]d here in MAU reporting: contractions since 0100, they are every 3 minutes. Denies bleeding or LOF. +FM  Onset of complaint: today  Pain score: 5/10  Vitals:   08/18/21 0713  BP: 140/79  Pulse: (!) 56  Resp: 20  Temp: 98.4 F (36.9 C)  SpO2: 100%     FHT: EFM applied in room  Lab orders placed from triage: none

## 2021-08-19 LAB — CBC
HCT: 32.8 % — ABNORMAL LOW (ref 36.0–46.0)
Hemoglobin: 10.3 g/dL — ABNORMAL LOW (ref 12.0–15.0)
MCH: 28.6 pg (ref 26.0–34.0)
MCHC: 31.4 g/dL (ref 30.0–36.0)
MCV: 91.1 fL (ref 80.0–100.0)
Platelets: 159 10*3/uL (ref 150–400)
RBC: 3.6 MIL/uL — ABNORMAL LOW (ref 3.87–5.11)
RDW: 14.4 % (ref 11.5–15.5)
WBC: 11.7 10*3/uL — ABNORMAL HIGH (ref 4.0–10.5)
nRBC: 0 % (ref 0.0–0.2)

## 2021-08-19 MED ORDER — INFLUENZA VAC SPLIT QUAD 0.5 ML IM SUSY: 0.5000 mL | PREFILLED_SYRINGE | INTRAMUSCULAR | Status: DC | PRN

## 2021-08-19 NOTE — Progress Notes (Signed)
Post Partum Day 1  Subjective: Patient is doing well. Her pain is controlled and her bleeding is minimal. She is eating, drinking, voiding, and ambulating without issue. She has passed flatus. She is formula feeding which is going well. She has no concerns at this time.  Objective: Blood pressure 121/86, pulse 77, temperature (!) 97.4 F (36.3 C), temperature source Oral, resp. rate 16, last menstrual period 12/09/2020, SpO2 99 %, unknown if currently breastfeeding.  Physical Exam:  General: alert, cooperative, and no distress Lochia: appropriate Uterine Fundus: firm DVT Evaluation: no LE edema or calf tenderness to palpation  Recent Labs    08/18/21 1525 08/19/21 0529  HGB 10.2* 10.3*  HCT 31.8* 32.8*    Assessment/Plan: Marissa Chavez is a 20 year old G1P1001 on PPD#1 s/p SVD.  Progressing well and meeting postpartum milestones. VSS.  gHTN: - BP elevated in the 140s/80s postpartum  - Started on Procardia 30 mg with improvement in pressures  - Asymptomatic - Will continue to monitor closely and adjust regimen as necessary - Meds to bed ordered; will need baby scripts and BP check in one week   A1GDM: - Fasting CBG ordered for tomorrow AM 9/23  Feeding: Formula  Contraception: Unsure  Circumcision: Desires; consented at bedside this AM  Dispo: Plan for discharge on PPD#2   LOS: 1 day   Worthy Rancher, MD 08/19/2021, 9:37 AM

## 2021-08-19 NOTE — Lactation Note (Signed)
This note was copied from a baby's chart. Lactation Consultation Note  Patient Name: Marissa Chavez [id number] Date: 08/19/2021 Reason for consult: Follow-up assessment Age:21 hours  LC in to room for follow up. Mother states breastfeeding is going well. Mother hand expresses and offers collected expressed breast milk. Additionally, infant is taking 10-12 mL of formula. Mother reports no using DEBP because she is not collecting anything.    Plan: 1-Breastfeeding on demand or 8-12 times in 24h period. 2-Keep infant awake during breastfeeding session: massaging breast, infant's hand/shoulder/feet 3-Encourage hand expression/pump use for expressed breast milk to "top infant up" 4-If needed, formula supplementation follow guidelines.  5-Reinforce maternal rest, hydration and food intake.   Contact LC as needed for feeds/support/concerns/questions. All questions answered at this time.    Maternal Data Has patient been taught Hand Expression?: Yes  Feeding Mother's Current Feeding Choice: Breast Milk and Formula Nipple Type: Slow - flow  Lactation Tools Discussed/Used Tools: Pump Breast pump type: Double-Electric Breast Pump Reason for Pumping: supplementation Pumping frequency: encouraged Q3  Interventions Interventions: Breast feeding basics reviewed;Education;DEBP;Expressed milk;Pace feeding;Hand express  Discharge Discharge Education: Engorgement and breast care  Consult Status Consult Status: Follow-up Date: 08/20/21 Follow-up type: In-patient    Marissa Chavez 08/19/2021, 10:00 PM

## 2021-08-20 ENCOUNTER — Other Ambulatory Visit (HOSPITAL_COMMUNITY): Payer: Self-pay

## 2021-08-20 ENCOUNTER — Encounter: Payer: Medicaid Other | Admitting: Certified Nurse Midwife

## 2021-08-20 LAB — GLUCOSE, CAPILLARY
Glucose-Capillary: 61 mg/dL — ABNORMAL LOW (ref 70–99)
Glucose-Capillary: 103 mg/dL — ABNORMAL HIGH (ref 70–99)

## 2021-08-20 LAB — SURGICAL PATHOLOGY

## 2021-08-20 MED ORDER — ACETAMINOPHEN 500 MG PO TABS
1000.0000 mg | ORAL_TABLET | Freq: Three times a day (TID) | ORAL | 0 refills | Status: DC | PRN
  Filled 2021-08-20: qty 60, 10d supply, fill #0

## 2021-08-20 MED ORDER — IBUPROFEN 600 MG PO TABS
600.0000 mg | ORAL_TABLET | Freq: Four times a day (QID) | ORAL | 0 refills | Status: DC | PRN
  Filled 2021-08-20: qty 40, 10d supply, fill #0

## 2021-08-20 MED ORDER — NIFEDIPINE ER 30 MG PO TB24
30.0000 mg | ORAL_TABLET | Freq: Every day | ORAL | 0 refills | Status: DC
  Filled 2021-08-20: qty 30, 30d supply, fill #0

## 2021-08-20 NOTE — Lactation Note (Addendum)
This note was copied from a baby's chart. Lactation Consultation Note  Patient Name: Marissa Chavez [id number] Date: 08/20/2021 Reason for consult: Follow-up assessment;Mother's request;Difficult latch;Primapara;1st time breastfeeding;Maternal endocrine disorder Age:21 hours  LC worked with mom how to latch infant with more depth and listen for audible swallows.   LC encouraged Mom to pump with manual q 3 hrs for 10 min each breast. Mom working on getting bigger flanges for her personal electric pump.   Mom breast have compression stripe but no other signs of trauma.   Plan 1. To feed based on cues 8-12x in 24 hr period. Mom to offer breasts and listen for audible swallows.  2. Mom to offer eBM if unable to get infant to latch.  3. Mom to supplement with EBM first followed by formula BF supplementation guide provided.  4. Mom to use manual pump for now until she gets 27 flanges for electric pump. 5. Mom to call on Monday for outpatient appt to work on latching and increasing her milk supply.  All questions answered at the end of the visit.   Mom will call for outpatient LC appt.on Monday to work on latch and her milk supply.   Maternal Data    Feeding Mother's Current Feeding Choice: Breast Milk and Formula  LATCH Score Latch: Repeated attempts needed to sustain latch, nipple held in mouth throughout feeding, stimulation needed to elicit sucking reflex.  Audible Swallowing: A few with stimulation  Type of Nipple: Everted at rest and after stimulation  Comfort (Breast/Nipple): Soft / non-tender  Hold (Positioning): Assistance needed to correctly position infant at breast and maintain latch.  LATCH Score: 7   Lactation Tools Discussed/Used Tools: Pump;Flanges Flange Size: 27 Breast pump type: Manual Pump Education: Setup, frequency, and cleaning;Milk Storage (Mom electric pump at home. she will call to get the 27 flanges needed. Mom to use manual pump for now.) Reason  for Pumping: increase stimulation Pumping frequency: every 3 hrs for 10 min each breast  Interventions Interventions: Breast feeding basics reviewed;Assisted with latch;Adjust position;Skin to skin;Support pillows;DEBP;Breast massage;Position options;Hand express;Expressed milk;Education;Pace feeding;Hand pump  Discharge Discharge Education: Engorgement and breast care;Warning signs for feeding baby;Outpatient recommendation Pump: Manual WIC Program: Yes  Consult Status Consult Status: Complete Date: 08/21/21    Sandrika Schwinn  Nicholson-Springer 08/20/2021, 4:40 PM

## 2021-08-20 NOTE — Discharge Summary (Signed)
Postpartum Discharge Summary     Patient Name: Marissa Chavez DOB: 28-Nov-2001 MRN: [id number]  Date of admission: 08/18/2021 Delivery date:08/18/2021  Delivering provider: Fatima Blank A  Date of discharge: 08/20/2021  Admitting diagnosis: Indication for care in labor and delivery, antepartum [O75.9] Intrauterine pregnancy: [redacted]w[redacted]d    Secondary diagnosis:  Principal Problem:   Vaginal delivery Active Problems:   Alpha thalassemia silent carrier   COVID-19 affecting pregnancy in third trimester   Anemia during pregnancy   Gestational diabetes mellitus (GDM), antepartum   GBS (group B Streptococcus carrier), +RV culture, currently pregnant   Gestational hypertension  Additional problems: None    Discharge diagnosis: Term Pregnancy Delivered                                              Post partum procedures: None Augmentation: AROM Complications: None  Hospital course: Onset of Labor With Vaginal Delivery      20y.o. yo G1P1001 at 352w0das admitted in Active Labor on 08/18/2021. Patient had an uncomplicated labor course as follows:  Membrane Rupture Time/Date: 11:17 AM ,08/18/2021   Delivery Method:Vaginal, Vacuum (Extractor)  Episiotomy: None  Lacerations:  1st degree;Perineal  Patient had an uncomplicated postpartum course.  She is ambulating, tolerating a regular diet, passing flatus, and urinating well. She had elevated blood pressures postpartum and was started on Procardia 30 mg daily with improvement in BP to normal range. She will continue this upon discharge and have a BP check in one week. Patient is discharged home in stable condition on 08/20/21.  Newborn Data: Birth date:08/18/2021  Birth time:11:23 AM  Gender:Female  Living status:Living  Apgars:8 ,9  Weight:3549 g   Magnesium Sulfate received: No BMZ received: No Rhophylac: N/A MMR: N/A - Immune T-DaP: Given prenatally Flu: N/A Transfusion: No  Physical exam  Vitals:   08/19/21 0405 08/19/21  0517 08/19/21 2046 08/20/21 0544  BP: 126/88 121/86 126/72 118/80  Pulse: (!) 55 77 79 64  Resp: _0 Temp: (!) 97.4 F (36.3 C)  98 F (36.7 C) 97.6 F (36.4 C)  TempSrc: Oral  Oral Oral  SpO2: 99%  99% 99%   General: alert, cooperative, and no distress Lochia: appropriate Uterine Fundus: firm DVT Evaluation: no LE edema or calf tenderness to palpation   Labs: Lab Results  Component Value Date   WBC 11.7 (H) 08/19/2021   HGB 10.3 (L) 08/19/2021   HCT 32.8 (L) 08/19/2021   MCV 91.1 08/19/2021   PLT 159 08/19/2021   CMP Latest Ref Rng & Units 08/18/2021  Glucose 70 - 99 mg/dL 102(H)  BUN 6 - 20 mg/dL 6  Creatinine 0.44 - 1.00 mg/dL 0.76  Sodium 135 - 145 mmol/L 135  Potassium 3.5 - 5.1 mmol/L 3.7  Chloride 98 - 111 mmol/L 107  CO2 22 - 32 mmol/L 22  Calcium 8.9 - 10.3 mg/dL 8.6(L)  Total Protein 6.5 - 8.1 g/dL 5.4(L)  Total Bilirubin 0.3 - 1.2 mg/dL 0.9  Alkaline Phos 38 - 126 U/L 235(H)  AST 15 - 41 U/L 27  ALT 0 - 44 U/L 18   Edinburgh Score: Edinburgh Postnatal Depression Scale Screening Tool 08/18/2021  I have been able to laugh and see the funny side of things. 0  I have looked forward with enjoyment to things. 0  I have  blamed myself unnecessarily when things went wrong. 0  I have been anxious or worried for no good reason. 0  I have felt scared or panicky for no good reason. 0  Things have been getting on top of me. 0  I have been so unhappy that I have had difficulty sleeping. 0  I have felt sad or miserable. 0  I have been so unhappy that I have been crying. 0  The thought of harming myself has occurred to me. 0  Edinburgh Postnatal Depression Scale Total 0     After visit meds:  Allergies as of 08/20/2021   No Known Allergies      Medication List     STOP taking these medications    Accu-Chek Nano SmartView w/Device Kit   Accu-Chek Softclix Lancets lancets   glucose blood test strip       TAKE these medications    acetaminophen  500 MG tablet Commonly known as: TYLENOL Take 2 tablets (1,000 mg total) by mouth every 8 (eight) hours as needed (pain).   ferrous sulfate 325 (65 FE) MG tablet Commonly known as: FerrouSul Take 1 tablet (325 mg total) by mouth every other day.   Hydrocortisone 2.5 % Kit Apply 1 application topically in the morning and at bedtime.   ibuprofen 600 MG tablet Commonly known as: ADVIL Take 1 tablet (600 mg total) by mouth every 6 (six) hours as needed (pain).   multivitamin-prenatal 27-0.8 MG Tabs tablet Take 1 tablet by mouth daily at 12 noon.   NIFEdipine 30 MG 24 hr tablet Commonly known as: ADALAT CC Take 1 tablet (30 mg total) by mouth daily.         Discharge home in stable condition Infant Feeding: Bottle Infant Disposition: home with mother Discharge instruction: per After Visit Summary and Postpartum booklet. Activity: Advance as tolerated. Pelvic rest for 6 weeks.  Diet: routine diet Future Appointments: Future Appointments  Date Time Provider [place]  08/20/2021 10:15 AM Gabriel Carina, CNM [facility]   Follow up Visit: Message sent to [facility] by Dr. Gwenlyn Perking on 08/20/21.  Please schedule this patient for a In person postpartum visit in 4 weeks with the following provider: Any provider. Additional Postpartum F/U: 2 hour GTT and BP check 1 week  High risk pregnancy complicated by: GDM and HTN Delivery mode:  Vaginal, Vacuum Neurosurgeon)  Anticipated Birth Control:  Unsure  08/20/2021 Genia Del, MD

## 2021-08-26 ENCOUNTER — Other Ambulatory Visit: Payer: Self-pay

## 2021-08-26 ENCOUNTER — Other Ambulatory Visit (INDEPENDENT_AMBULATORY_CARE_PROVIDER_SITE_OTHER): Payer: Medicaid Other

## 2021-08-26 VITALS — BP 133/93 | HR 71 | Wt 137.9 lb

## 2021-08-26 DIAGNOSIS — Z0130 Encounter for examination of blood pressure without abnormal findings: Secondary | ICD-10-CM

## 2021-08-26 DIAGNOSIS — O24410 Gestational diabetes mellitus in pregnancy, diet controlled: Secondary | ICD-10-CM

## 2021-08-26 NOTE — Progress Notes (Signed)
Blood Pressure Check Visit  Marissa Chavez is here for blood pressure check following spontaneous vaginal delivery on 08/18/21. Pt has diagnosis of gestational hypertension and was discharged home on Nifedipine 30 mg daily. Pt did not take BP this AM prior to appt. BP today is 133/93, HR 71. Patient denies/endorses any s/s of elevated BP. Reviewed with Alvester Morin, MD who states patient may follow up in 1 week for blood pressure check. This will be scheduled to coordinate with lactation consultation appt. Reports milk supply is good but is having difficulty with latch. Currently pumping and feeding via bottle.  Pt was unable to stay for 2 hour glucose test. Will complete this at postpartum appt.  Marjo Bicker, RN 08/26/2021  11:10 AM

## 2021-08-27 NOTE — Progress Notes (Signed)
Attestation of Attending Supervision of clinical support staff: I agree with the care provided to this patient and was consulted.  I have reviewed the RN's note and chart. I was available to see the patient if needed.   Federico Flake, MD, MPH, ABFM Attending Physician Faculty Practice- [facility] for [facility]

## 2021-08-30 ENCOUNTER — Telehealth (HOSPITAL_COMMUNITY): Payer: Self-pay | Admitting: *Deleted

## 2021-08-30 NOTE — Telephone Encounter (Signed)
Patient voiced no questions or concerns regarding her own health. EPDS = 2. Patient voiced no questions or concerns regarding baby at this time. Patient reported infant sleeps in a crib on his back. RN reviewed ABCs of safe sleep - patient verbalized understanding. Patient requested RN email information on hospital's virtual postpartum classes and support groups. Email sent. Deforest Hoyles, RN, 08/30/21, [phone].

## 2021-08-31 ENCOUNTER — Ambulatory Visit (INDEPENDENT_AMBULATORY_CARE_PROVIDER_SITE_OTHER): Payer: Medicaid Other

## 2021-08-31 ENCOUNTER — Other Ambulatory Visit: Payer: Self-pay

## 2021-08-31 NOTE — Progress Notes (Signed)
Pt here today for lactation appt with infant, but told Jasmine December, RN that she had needed PP BP check done. Pt was seen last week y [place], [place] and had elevated BP due to not taking BP meds as directed.  Pt denies any headaches, swelling or visual changes. Pt states has been taking Rx Procardia 30mg  as directed.  BP today 125/76. Pt advised to keep taking BP meds until PP appt which is scheduled for 11/3 with PP 2 hr GTT. Pt agreeable to date and time of appt.  13/3, RN

## 2021-08-31 NOTE — Progress Notes (Signed)
I have reviewed the chart and agree with the nurse's note and plan of care for this visit.   Yanely Mast Leftwich-Kirby, CNM 5:11 PM  

## 2021-09-03 ENCOUNTER — Encounter: Payer: Self-pay | Admitting: Radiology

## 2021-09-30 ENCOUNTER — Ambulatory Visit: Payer: Medicaid Other | Admitting: Obstetrics & Gynecology

## 2021-09-30 ENCOUNTER — Other Ambulatory Visit: Payer: Medicaid Other

## 2021-09-30 ENCOUNTER — Ambulatory Visit: Payer: Medicaid Other | Admitting: Obstetrics and Gynecology

## 2021-10-29 ENCOUNTER — Other Ambulatory Visit: Payer: Self-pay | Admitting: *Deleted

## 2021-10-29 DIAGNOSIS — O24429 Gestational diabetes mellitus in childbirth, unspecified control: Secondary | ICD-10-CM

## 2021-11-01 ENCOUNTER — Other Ambulatory Visit: Payer: Medicaid Other

## 2021-11-10 ENCOUNTER — Ambulatory Visit (INDEPENDENT_AMBULATORY_CARE_PROVIDER_SITE_OTHER): Payer: Medicaid Other | Admitting: Certified Nurse Midwife

## 2021-11-10 ENCOUNTER — Other Ambulatory Visit: Payer: Self-pay

## 2021-11-10 ENCOUNTER — Other Ambulatory Visit (HOSPITAL_COMMUNITY)
Admission: RE | Admit: 2021-11-10 | Discharge: 2021-11-10 | Disposition: A | Discharge status: Home / Self Care | Payer: Medicaid Other | Source: Ambulatory Visit | Attending: Family Medicine | Admitting: Family Medicine

## 2021-11-10 ENCOUNTER — Encounter: Payer: Self-pay | Admitting: Family Medicine

## 2021-11-10 DIAGNOSIS — Z30011 Encounter for initial prescription of contraceptive pills: Secondary | ICD-10-CM | POA: Diagnosis not present

## 2021-11-10 DIAGNOSIS — Z124 Encounter for screening for malignant neoplasm of cervix: Secondary | ICD-10-CM | POA: Diagnosis present

## 2021-11-10 MED ORDER — NORETHINDRONE 0.35 MG PO TABS: 1.0000 | ORAL_TABLET | Freq: Every day | ORAL | 11 refills | Status: DC

## 2021-11-10 NOTE — Progress Notes (Signed)
Post Partum Visit Note  Marissa Chavez is a 20 y.o. G36P1001 female who presents for a postpartum visit. She is  12  weeks postpartum following a normal spontaneous vaginal delivery.  I have fully reviewed the prenatal and intrapartum course. The delivery was at 39w gestational weeks.  Anesthesia: epidural. Postpartum course has been complicated. Baby had a heart attack at 4wks old (09/27/21) and was diagnosed with an ALCAPA heart defect. He had heart surgery and was released from [facility] just before Thanksgiving. Baby is now doing well and still mostly breastfed. Baby is feeding by both breast and bottle - enfamil . Bleeding no bleeding. Bowel function is normal. Bladder function is normal. Patient is sexually active. Contraception method is none. Postpartum depression screening: negative.  Upstream - 11/10/21 2009       Pregnancy Intention Screening   Does the patient want to become pregnant in the next year? No    Does the patient's partner want to become pregnant in the next year? No    Would the patient like to discuss contraceptive options today? Yes      Contraception Wrap Up   Current Method No Contraceptive Precautions    End Method Oral Contraceptive    Contraception Counseling Provided Yes            The pregnancy intention screening data noted above was reviewed. Potential methods of contraception were discussed. The patient elected to proceed with Oral Contraceptive.   Edinburgh Postnatal Depression Scale - 11/10/21 1554       Edinburgh Postnatal Depression Scale:  In the Past 7 Days   I have been able to laugh and see the funny side of things. 0    I have looked forward with enjoyment to things. 0    I have blamed myself unnecessarily when things went wrong. 0    I have been anxious or worried for no good reason. 0    I have felt scared or panicky for no good reason. 0    Things have been getting on top of me. 0    I have been so unhappy that I have had  difficulty sleeping. 0    I have felt sad or miserable. 0    I have been so unhappy that I have been crying. 0    The thought of harming myself has occurred to me. 0    Edinburgh Postnatal Depression Scale Total 0            Health Maintenance Due  Topic Date Due   Pneumococcal Vaccine 71-19 Years old (1 - PCV) Never done   URINE MICROALBUMIN  Never done   HPV VACCINES (1 - 2-dose series) Never done   TETANUS/TDAP  Never done   INFLUENZA VACCINE  Never done   PAP-Cervical Cytology Screening  Never done   PAP SMEAR-Modifier  Never done    The following portions of the patient's history were reviewed and updated as appropriate: allergies, current medications, past family history, past medical history, past social history, past surgical history, and problem list.  Review of Systems Pertinent items noted in HPI and remainder of comprehensive ROS otherwise negative.  Objective:  BP 127/86    Pulse 75    Wt 121 lb 3.2 oz (55 kg)    LMP  (LMP Unknown)    Breastfeeding Yes    BMI 18.98 kg/m    General:  alert, cooperative, and appears stated age   Breasts:  not  indicated  Lungs: Normal effort  Heart:  regular rate and rhythm  Abdomen: Soft, non-tender    Wound Not indicated  GU exam:  normal, pap collected       Assessment:  Postpartum care and examination of lactating mother  Cervical cancer screening - Plan: Cytology - PAP( [place])  Encounter for initial prescription of contraceptive pills - Plan: norethindrone (MICRONOR) 0.35 MG tablet   Plan:   Essential components of care per ACOG recommendations:  1.  Mood and well being: Patient with negative depression screening today. Reviewed local resources for support.  - Patient tobacco use? No.   - hx of drug use? No.    2. Infant care and feeding:  -Patient currently breastmilk feeding? Yes. Reviewed importance of draining breast regularly to support lactation.  -Social determinants of health (SDOH) reviewed in  EPIC.   3. Sexuality, contraception and birth spacing - Patient does not want a pregnancy in the next year.  Desired family size is 1 children.  - Reviewed forms of contraception in tiered fashion. Patient desired oral progesterone-only contraceptive today.   - Discussed birth spacing of 18 months  4. Sleep and fatigue -Encouraged family/partner/community support of 4 hrs of uninterrupted sleep to help with mood and fatigue  5. Physical Recovery  - Discussed patients delivery and complications. She describes her labor as mixed. - Patient had a Vaginal problems after delivery including PPH . Patient had a 1st degree laceration. Perineal healing reviewed. Patient expressed understanding - Patient has urinary incontinence? No. - Patient is safe to resume physical and sexual activity  6.  Health Maintenance - HM due items addressed Yes - Last pap smear No results found for: DIAGPAP Pap smear done at today's visit.  -Breast Cancer screening indicated? No.   7. Chronic Disease/Pregnancy Condition follow up: None - PCP follow up as needed  Bernerd Limbo, CNM [facility] for [employer]

## 2021-11-12 LAB — CYTOLOGY - PAP
Chlamydia: NEGATIVE
Comment: NEGATIVE
Comment: NEGATIVE
Comment: NORMAL
Diagnosis: NEGATIVE
Neisseria Gonorrhea: NEGATIVE
Trichomonas: NEGATIVE

## 2021-11-26 ENCOUNTER — Other Ambulatory Visit: Payer: Medicaid Other

## 2022-05-10 IMAGING — US US MFM OB FOLLOW-UP
1 series · 12 of 28 positions shown · non-contrast
Comparison: none

[Series 1: us mfm ob follow-up · 12 of 40 slices shown]
[im 2/40]
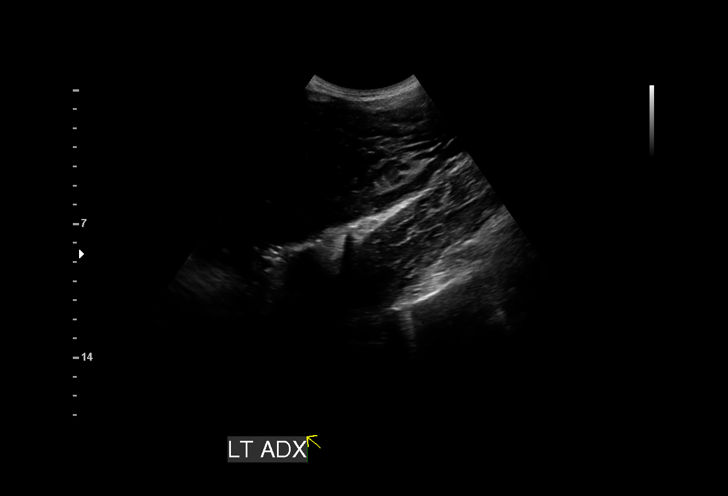
[im 5/40]
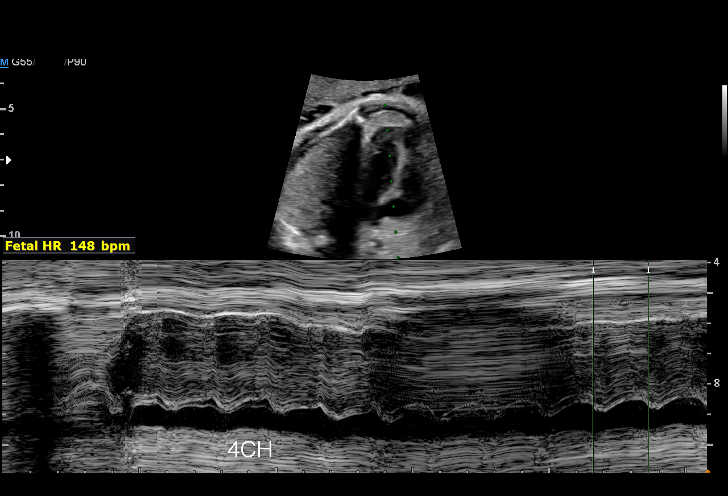
[im 8/40]
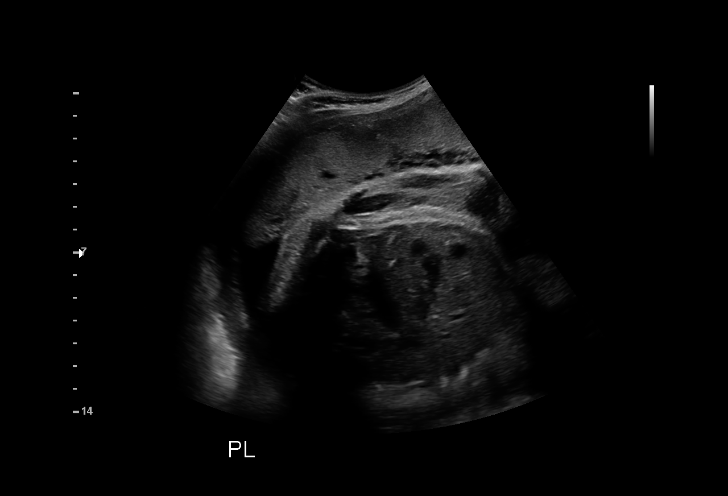
[im 12/40]
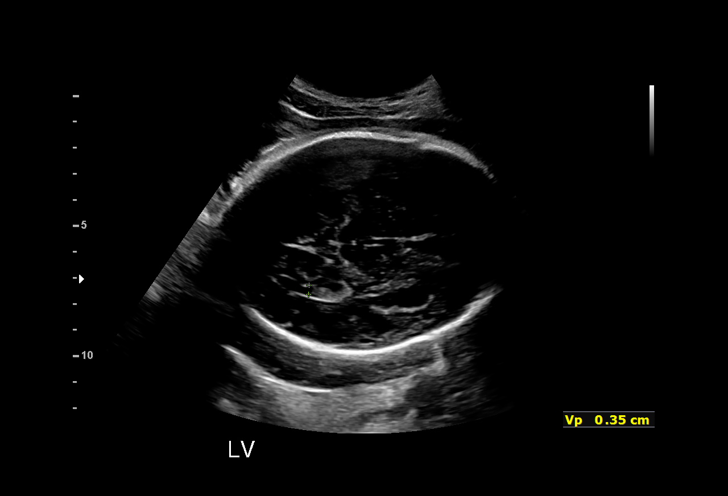
[im 15/40]
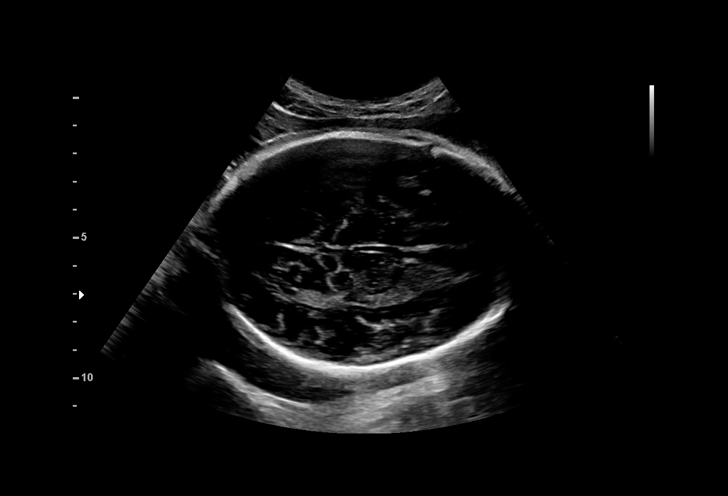
[im 18/40]
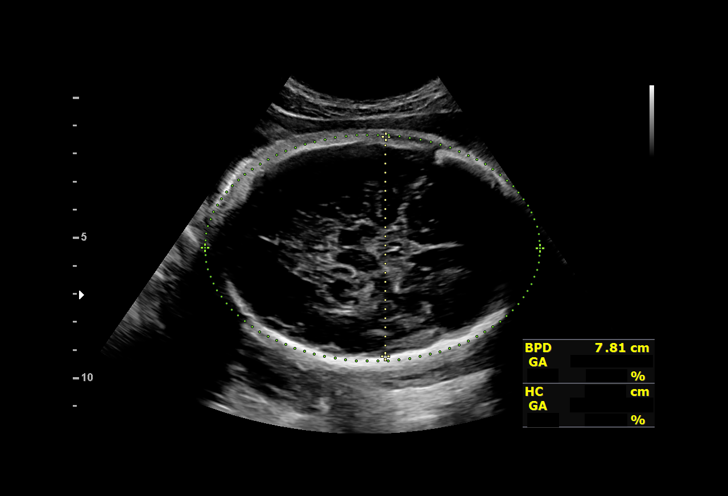
[im 22/40]
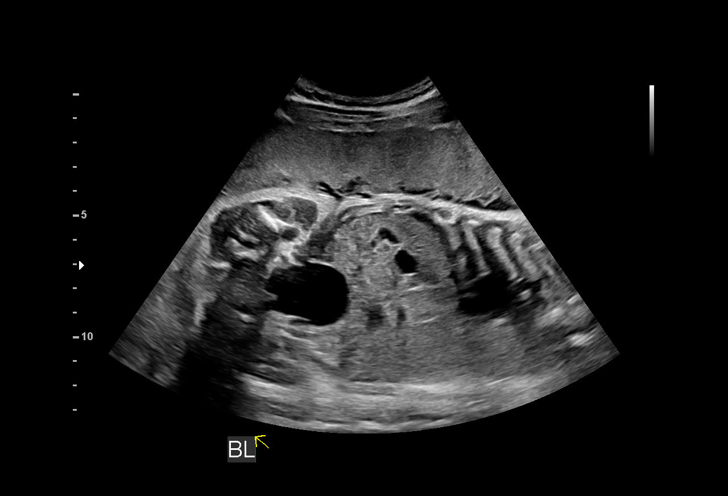
[im 25/40]
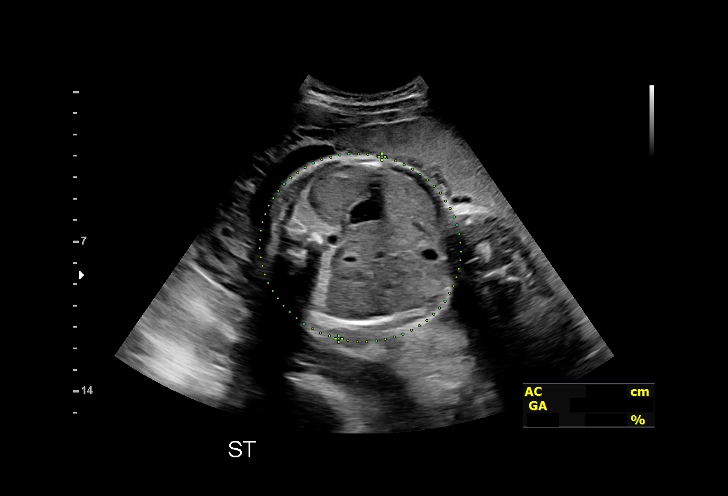
[im 28/40]
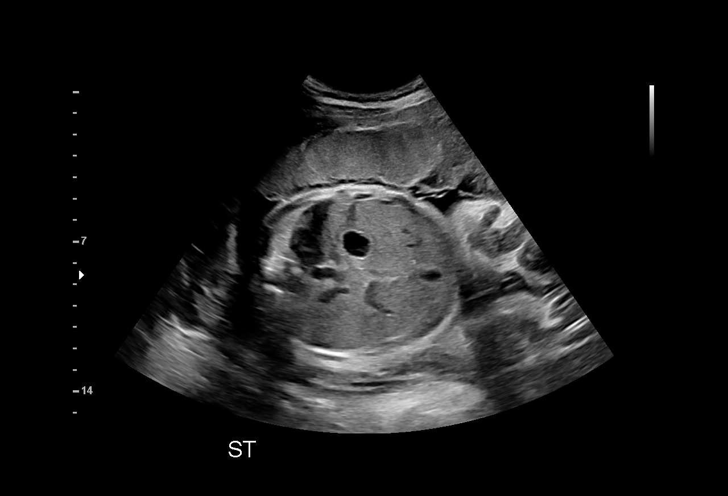
[im 32/40]
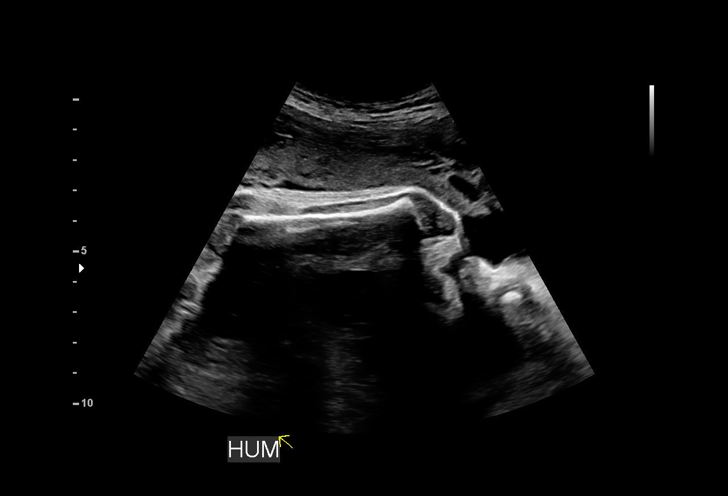
[im 35/40]
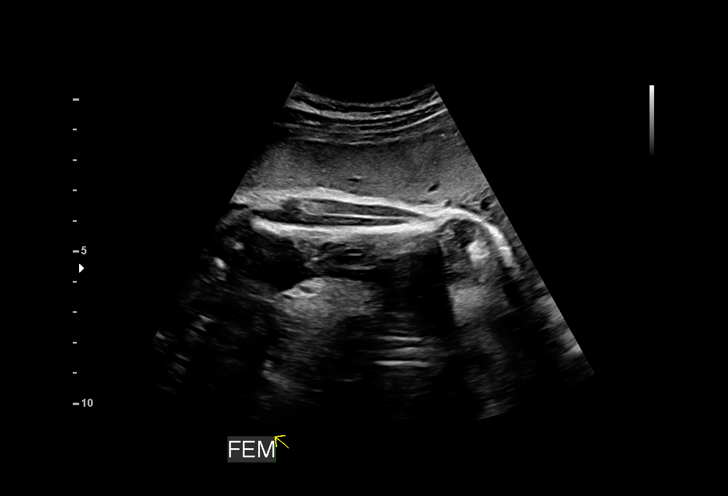
[im 38/40]
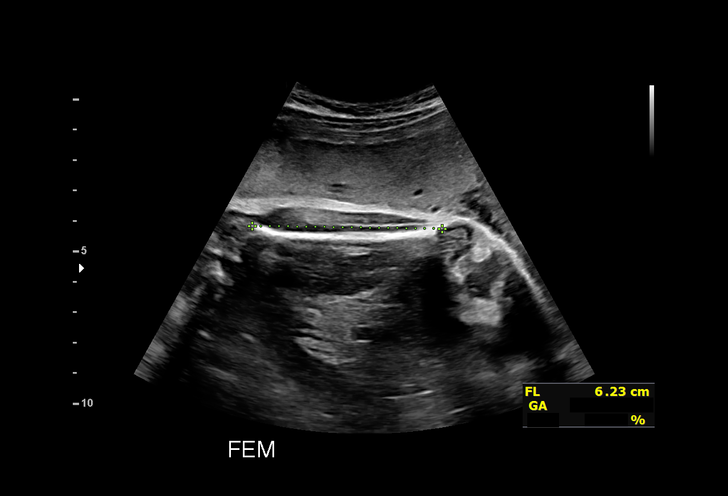

[12 of 28 positions shown; findings below may reference images not displayed]

Attending:        Pg Patin        Secondary Phy.:   LAMONTE DELFIN
                                                            PADAM CNM
                                                            [facility] [HOSPITAL]
                   [id number]

Indications

 Gestational diabetes in pregnancy,
 unspecified control
 32 weeks gestation of pregnancy
 Encounter for other antenatal screening
 follow-up
 LR NIPS
Fetal Evaluation

 Num Of Fetuses:         1
 Preg. Location:         Intrauterine
 Fetal Heart Rate(bpm):  148
 Cardiac Activity:       Observed
 Presentation:           Cephalic
 Placenta:               Anterior

 Amniotic Fluid
 AFI FV:      Within normal limits

 AFI Sum(cm)     %Tile       Largest Pocket(cm)
 16.7            61

 RUQ(cm)       RLQ(cm)       LUQ(cm)        LLQ(cm)

Biophysical Evaluation

 Amniotic F.V:   Within normal limits       F. Tone:        Observed
 F. Movement:    Observed                   Score:          [DATE]
 F. Breathing:   Observed
Biometry

 BPD:      80.2  mm     G. Age:  32w 1d         28  %    CI:        65.11   %    70 - 86
                                                         FL/HC:      19.5   %    19.9 -
 HC:      319.3  mm     G. Age:  36w 0d         90  %    HC/AC:      1.08        0.96 -
 AC:      295.6  mm     G. Age:  33w 4d         74  %    FL/BPD:     77.8   %    71 - 87
 FL:       62.4  mm     G. Age:  32w 2d         27  %    FL/AC:      21.1   %    20 - 24
 HUM:      57.9  mm     G. Age:  33w 4d         75  %

 LV:        3.5  mm

 Est. FW:    7488  gm    4 lb 12 oz      59  %
OB History

 Gravidity:    1         Term:   0
 Living:       0
Gestational Age

 LMP:           29w 5d        Date:  12/09/20                 EDD:   09/15/21
 U/S Today:     33w 4d                                        EDD:   08/19/21
 Best:          32w 5d     Det. By:  Previous Ultrasound      EDD:   08/25/21
                                     (02/10/21)
Anatomy

 Cranium:               Appears normal         Aortic Arch:            Previously seen
 Cavum:                 Appears normal         Ductal Arch:            Previously seen
 Ventricles:            Appears normal         Diaphragm:              Appears normal
 Choroid Plexus:        Previously seen        Stomach:                Appears normal, left
                                                                       sided
 Cerebellum:            Previously seen        Abdomen:                Appears normal
 Posterior Fossa:       Previously seen        Abdominal Wall:         Previously seen
 Nuchal Fold:           Not applicable (>20    Cord Vessels:           Appears normal (3
                        wks GA)                                        vessel cord)
 Face:                  Profile appears        Kidneys:                Appear normal
                        normal
 Lips:                  Previously seen        Bladder:                Appears normal
 Thoracic:              Previously seen        Spine:                  Previously seen
 Heart:                 Limited Views          Upper Extremities:      Visualized
 RVOT:                  Previously seen        Lower Extremities:      Visualized
 LVOT:                  Previously seen

 Other:  Male gender previously seen.
Cervix Uterus Adnexa

 Cervix
 Not visualized (advanced GA >72wks)
 Uterus
 No abnormality visualized.

 Right Ovary
 Not visualized.

 Left Ovary
 Not visualized.

 Cul De Sac
 No free fluid seen.

 Adnexa
 No abnormality visualized.
Impression

 Fetal growth is appropriate for gestational age.  Amniotic fluid
 is normal good fetal activity seen.  Antenatal testing is
 reassuring.  BPP [DATE].
 xxxxxxxxxxxxxxxxxxxxxxxxxxxxxxxxxxxxxx
 Consultation (see [REDACTED] )

 I had the pleasure of [facility] She is G1 P0 at 32w 5d gestation
 and is here for fetal growth assessment.  She has gestational
 diabetes.
 Patient met with our diabetic educator and started checking
 her blood glucose but not regularly.  She reports some of her
 levels are increased.  She has not brought her logbook today.
 Patient does not have hypertension.  Blood pressure today at
 her office is 122/62 mmHg
 She reports no other chronic medical conditions.  She had
 [id number] infection in this pregnancy and has completely
 recovered.  She also had non-gonococcal urethritis that was
 treated with azithromycin.
 Ultrasound
 Fetal growth is appropriate for gestational age.  Amniotic fluid
 is normal good fetal activity seen.  Antenatal testing is
 reassuring.  BPP [DATE].
 I counseled the patient on the following:
 Gestational diabetes
 -I emphasized the importance of good blood glucose control
 to prevent neonatal or fetal adverse outcomes including
 stillbirth.  I encouraged her to check her blood glucose
 regularly.
 -Complications include fetal macrosomia, shoulder dystocia
 and difficult delivery leading to neurological complications.
 -Timing of delivery will be based on diabetic control.  If
 diabetes is well controlled she may be delivered at 39 weeks
 gestation.  If not well controlled, early term delivery is
 appropriate.
 -Patient reported that she will be checking her blood glucose
 regularly.  I encouraged her to bring her logbook at her
 ultrasound visits.
 -I discussed the ultrasound protocol of weekly antenatal
 testing.
Recommendations

 -Weekly BPP till delivery.
 -Patient to bring her blood glucose log at her ultrasound visits.
 -Postpartum screening for type 2 diabetes.
                 Casares, Nayelie

## 2022-07-07 ENCOUNTER — Ambulatory Visit
Admission: EM | Admit: 2022-07-07 | Discharge: 2022-07-07 | Disposition: A | Discharge status: Home / Self Care | Payer: Medicaid Other | Attending: Physician Assistant | Admitting: Physician Assistant

## 2022-07-07 DIAGNOSIS — Z113 Encounter for screening for infections with a predominantly sexual mode of transmission: Secondary | ICD-10-CM | POA: Diagnosis not present

## 2022-07-07 NOTE — ED Triage Notes (Signed)
Pt reports to [facility] with [facility] of vaginal discharge and discomfort since yesterday. Pt reports she recently had a baby 8 months ago but child has been critically ill and she has not been able to stop and take care of her self. Pt reports that she thinks her episiotomy scare is not correct.

## 2022-07-09 ENCOUNTER — Encounter: Payer: Self-pay | Admitting: Physician Assistant

## 2022-07-09 NOTE — ED Provider Notes (Signed)
[facility]    CSN: [id number] Arrival date & time: 07/07/22  1859      History   Chief Complaint Chief Complaint  Patient presents with   vaginal discomfort    HPI Marissa Chavez is a 21 y.o. female.   Patient here today for evaluation of vaginal discharge and discomfort.  She would like STD screening.  She notes she had an episiotomy with her pregnancy 8 months ago but is not sure if this healed correctly if she did not return to see her OB due to baby's health concerns.  She does not report any fever.  The history is provided by the patient.    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 Authorizing Provider  acetaminophen (TYLENOL) 500 MG tablet Take 2 tablets (1,000 mg total) by mouth every 8 (eight) hours as needed (pain). Patient not taking: Reported on 11/10/2021 08/20/21   Worthy Rancher, MD  ferrous sulfate (FERROUSUL) 325 (65 FE) MG tablet Take 1 tablet (325 mg total) by mouth every other day. Patient not taking: Reported on 11/10/2021 06/10/21   Sheila Oats, MD  NIFEdipine (ADALAT CC) 30 MG 24 hr tablet Take 1 tablet (30 mg total) by mouth daily. Patient not taking: Reported on 11/10/2021 08/20/21   Worthy Rancher, MD   norethindrone (MICRONOR) 0.35 MG tablet Take 1 tablet (0.35 mg total) by mouth daily. 11/10/21   Bernerd Limbo, CNM  Prenatal Vit-Fe Fumarate-FA (MULTIVITAMIN-PRENATAL) 27-0.8 MG TABS tablet Take 1 tablet by mouth daily at 12 noon. Patient not taking: Reported on 11/10/2021    [provider]    Family History Family History  Problem Relation Age of Onset   Hypertension Mother    Depression Father    Hypertension Maternal Grandfather    Hypertension Paternal Grandmother    Kidney disease Paternal Grandmother     Social History Social History   Tobacco Use   Smoking status: Never   Smokeless tobacco: Never  Vaping Use   Vaping Use: Never used  Substance Use Topics   Alcohol use: Not Currently   Drug use: Not Currently     Allergies   Patient has no known allergies.   Review of Systems Review of Systems  Constitutional:  Negative for chills and fever.  Eyes:  Negative for discharge and redness.  Gastrointestinal:  Negative for abdominal pain, nausea and vomiting.  Genitourinary:  Positive for vaginal discharge. Negative for genital sores and vaginal  bleeding.     Physical Exam Triage Vital Signs ED Triage Vitals  Enc Vitals Group     BP 07/07/22 1939 (!) 142/85     Pulse Rate 07/07/22 1939 71     Resp 07/07/22 1939 18     Temp 07/07/22 1939 98.2 F (36.8 C)     Temp src --      SpO2 07/07/22 1939 98 %     Weight --      Height --      Head Circumference --      Peak Flow --      Pain Score 07/07/22 1943 0     Pain Loc --      Pain Edu? --      Excl. in GC? --    No data found.  Updated Vital Signs BP (!) 142/85   Pulse 71   Temp 98.2 F (36.8 C)   Resp 18   LMP  (LMP Unknown)   SpO2 98%      Physical Exam Vitals and nursing note reviewed.  Constitutional:      General: She is not in acute distress.    Appearance: Normal appearance. She is not ill-appearing.  HENT:     Head: Normocephalic and atraumatic.  Eyes:      Conjunctiva/sclera: Conjunctivae normal.  Cardiovascular:     Rate and Rhythm: Normal rate.  Pulmonary:     Effort: Pulmonary effort is normal.  Neurological:     Mental Status: She is alert.  Psychiatric:        Mood and Affect: Mood normal.        Behavior: Behavior normal.        Thought Content: Thought content normal.      UC Treatments / Results  Labs (all labs ordered are listed, but only abnormal results are displayed) Labs Reviewed  HIV ANTIBODY (ROUTINE TESTING W REFLEX)  RPR  HEPATITIS PANEL, ACUTE  CERVICOVAGINAL ANCILLARY ONLY    EKG   Radiology No results found.  Procedures Procedures (including critical care time)  Medications Ordered in UC Medications - No data to display  Initial Impression / Assessment and Plan / UC Course  I have reviewed the triage vital signs and the nursing notes.  Pertinent labs & imaging results that were available during my care of the patient were reviewed by me and considered in my medical decision making (see chart for details).    STD screening ordered.  Will await results further recommendation.  Encouraged follow-up with OB regarding episiotomy healing etc.  Final Clinical Impressions(s) / UC Diagnoses   Final diagnoses:  Screening for STD (sexually transmitted disease)   Discharge Instructions   None    ED Prescriptions   None    PDMP not reviewed this encounter.   Tomi Bamberger, PA-C 07/09/22 1130

## 2022-07-11 ENCOUNTER — Telehealth (HOSPITAL_COMMUNITY): Payer: Self-pay | Admitting: Emergency Medicine

## 2022-07-11 LAB — RPR: RPR Ser Ql: NONREACTIVE

## 2022-07-11 LAB — CERVICOVAGINAL ANCILLARY ONLY
Bacterial Vaginitis (gardnerella): POSITIVE — AB
Candida Glabrata: NEGATIVE
Candida Vaginitis: POSITIVE — AB
Chlamydia: NEGATIVE
Comment: NEGATIVE
Comment: NEGATIVE
Comment: NEGATIVE
Comment: NEGATIVE
Comment: NEGATIVE
Comment: NORMAL
Neisseria Gonorrhea: NEGATIVE
Trichomonas: NEGATIVE

## 2022-07-11 LAB — HIV ANTIBODY (ROUTINE TESTING W REFLEX): HIV Screen 4th Generation wRfx: NONREACTIVE

## 2022-07-11 MED ORDER — METRONIDAZOLE 500 MG PO TABS: 500.0000 mg | ORAL_TABLET | Freq: Two times a day (BID) | ORAL | 0 refills | Status: DC

## 2022-07-11 MED ORDER — FLUCONAZOLE 150 MG PO TABS: 150.0000 mg | ORAL_TABLET | Freq: Once | ORAL | 0 refills | Status: AC

## 2022-07-13 LAB — ACUTE VIRAL HEPATITIS (HAV, HBV, HCV)
HCV Ab: NONREACTIVE
Hep A IgM: NEGATIVE
Hep B C IgM: NEGATIVE
Hepatitis B Surface Ag: NEGATIVE

## 2022-07-13 LAB — HCV INTERPRETATION

## 2022-07-13 LAB — SPECIMEN STATUS REPORT

## 2022-07-29 ENCOUNTER — Encounter (HOSPITAL_COMMUNITY): Payer: Self-pay | Admitting: Emergency Medicine

## 2022-07-29 ENCOUNTER — Emergency Department (HOSPITAL_COMMUNITY)
Admission: EM | Admit: 2022-07-29 | Discharge: 2022-07-29 | Disposition: A | Discharge status: Home / Self Care | Payer: Medicaid Other | Attending: Emergency Medicine | Admitting: Emergency Medicine

## 2022-07-29 ENCOUNTER — Other Ambulatory Visit: Payer: Self-pay

## 2022-07-29 DIAGNOSIS — K0889 Other specified disorders of teeth and supporting structures: Secondary | ICD-10-CM | POA: Insufficient documentation

## 2022-07-29 MED ORDER — PENICILLIN V POTASSIUM 500 MG PO TABS
500.0000 mg | ORAL_TABLET | Freq: Four times a day (QID) | ORAL | 0 refills | Status: AC
Start: 2022-07-29 — End: 2022-08-08

## 2022-07-29 MED ORDER — NAPROXEN 500 MG PO TABS: 500.0000 mg | ORAL_TABLET | Freq: Two times a day (BID) | ORAL | 0 refills | Status: DC

## 2022-07-29 MED ORDER — PENICILLIN V POTASSIUM 250 MG PO TABS
500.0000 mg | ORAL_TABLET | Freq: Once | ORAL | Status: AC
  Administered 2022-07-29: 500 mg via ORAL
  Filled 2022-07-29: qty 2

## 2022-07-29 MED ORDER — NAPROXEN 250 MG PO TABS
500.0000 mg | ORAL_TABLET | Freq: Once | ORAL | Status: AC
  Administered 2022-07-29: 500 mg via ORAL
  Filled 2022-07-29: qty 2

## 2022-07-29 NOTE — ED Triage Notes (Signed)
Patient reports worsening right upper molar pain with swelling onset this week unrelieved by OTC Oragel.

## 2022-07-29 NOTE — ED Provider Notes (Signed)
[facility] EMERGENCY DEPARTMENT Provider Note   CSN: [id number] Arrival date & time: 07/29/22  2214     History  Chief Complaint  Patient presents with   Dental Pain    Marissa Chavez is a 21 y.o. female.  The history is provided by the patient and medical records.  Dental Pain Associated symptoms: facial swelling    21 year old female presenting to the ED with right lower facial swelling.  Began today around 5 PM, worsening since then.  Patient does have local dentist that she sees, planning to see them Monday. She denies fever, chills, trouble swallowing, or SOB.  Tried using OTC orajel without relief.  Home Medications Prior to Admission medications   Medication Sig Start Date End Date Taking? Authorizing Provider  naproxen (NAPROSYN) 500 MG tablet Take 1 tablet (500 mg total) by mouth 2 (two) times daily. 07/29/22  Yes Garlon Hatchet, PA-C  penicillin v potassium (VEETID) 500 MG tablet Take 1 tablet (500 mg total) by mouth 4 (four) times daily for 10 days. 07/29/22 08/08/22 Yes Garlon Hatchet, PA-C  acetaminophen (TYLENOL) 500 MG tablet Take 2 tablets (1,000 mg total) by mouth every 8 (eight) hours as needed (pain). Patient not taking: Reported on 11/10/2021 08/20/21   Worthy Rancher, MD  ferrous sulfate (FERROUSUL) 325 (65 FE) MG tablet Take 1 tablet (325 mg total) by mouth every other day. Patient not taking: Reported on 11/10/2021 06/10/21   Sheila Oats, MD  metroNIDAZOLE (FLAGYL) 500 MG tablet Take 1 tablet (500 mg total) by mouth 2 (two) times daily. 07/11/22   LampteyBritta Mccreedy, MD  NIFEdipine (ADALAT CC) 30 MG 24 hr tablet Take 1 tablet (30 mg total) by mouth daily. Patient not taking: Reported on 11/10/2021 08/20/21   Worthy Rancher, MD  norethindrone (MICRONOR) 0.35 MG tablet Take 1 tablet (0.35 mg total) by mouth daily. 11/10/21   Bernerd Limbo, CNM  Prenatal Vit-Fe Fumarate-FA (MULTIVITAMIN-PRENATAL) 27-0.8 MG TABS tablet Take 1 tablet  by mouth daily at 12 noon. Patient not taking: Reported on 11/10/2021    [provider]      Allergies    Patient has no known allergies.    Review of Systems   Review of Systems  HENT:  Positive for dental problem and facial swelling.   All other systems reviewed and are negative.   Physical Exam Updated Vital Signs BP 122/67   Pulse 65   Temp 98.8 F (37.1 C) (Oral)   Resp 16   LMP  (LMP Unknown)   SpO2 100%  Physical Exam Vitals and nursing note reviewed.  Constitutional:      Appearance: She is well-developed.  HENT:     Head: Normocephalic and atraumatic.     Mouth/Throat:     Comments: Teeth largely in fair dentition, right upper molars TTP, surrounding gingiva swollen without discrete abscess or fluid collection, handling secretions appropriately, no trismus, swelling of right upper cheek without extension into the eye or down into the neck, normal phonation without stridor Eyes:     Conjunctiva/sclera: Conjunctivae normal.     Pupils: Pupils are equal, round, and reactive to light.  Cardiovascular:     Rate and Rhythm: Normal rate and regular rhythm.     Heart sounds: Normal heart sounds.  Pulmonary:     Effort: Pulmonary effort is normal.     Breath sounds: Normal breath sounds.  Abdominal:     General: Bowel sounds are normal.  Palpations: Abdomen is soft.  Musculoskeletal:        General: Normal range of motion.     Cervical back: Normal range of motion.  Skin:    General: Skin is warm and dry.  Neurological:     Mental Status: She is alert and oriented to person, place, and time.     ED Results / Procedures / Treatments   Labs (all labs ordered are listed, but only abnormal results are displayed) Labs Reviewed - No data to display  EKG None  Radiology No results found.  Procedures Procedures    Medications Ordered in ED Medications  penicillin v potassium (VEETID) tablet 500 mg (500 mg Oral Given 07/29/22 2348)  naproxen  (NAPROSYN) tablet 500 mg (500 mg Oral Given 07/29/22 2348)    ED Course/ Medical Decision Making/ A&P                           Medical Decision Making Risk Prescription drug management.   21 year old female with right-sided dental pain overall, patient today around 5 PM.  Afebrile, nontoxic.  Swelling noted along right upper cheek but no visible abscess or fluid collection.  Handling secretions well, no stridor.  Not clinically concerning for ludwig's angina.  Will start on abx, she will follow-up with her dentist next week.  Return here for new concerns.  Final Clinical Impression(s) / ED Diagnoses Final diagnoses:  Pain, dental    Rx / DC Orders ED Discharge Orders          Ordered    naproxen (NAPROSYN) 500 MG tablet  2 times daily        07/29/22 2338    penicillin v potassium (VEETID) 500 MG tablet  4 times daily        07/29/22 2338              Garlon Hatchet, PA-C 07/29/22 2356    Melene Plan, DO 07/30/22 0130

## 2022-07-29 NOTE — ED Notes (Signed)
Patient verbalizes understanding of discharge instructions. Opportunity for questioning and answers were provided. Armband removed by staff, pt discharged from ED. Pt ambulatory to ED waiting room with steady gait.  

## 2022-07-29 NOTE — Discharge Instructions (Signed)
Take the prescribed medication as directed.  Soft diet for now if having trouble chewing. Follow-up with your dentist next week. Return to the ED for new or worsening symptoms.

## 2022-07-30 ENCOUNTER — Other Ambulatory Visit: Payer: Self-pay

## 2022-07-30 ENCOUNTER — Encounter (HOSPITAL_COMMUNITY): Payer: Self-pay | Admitting: Emergency Medicine

## 2022-07-30 ENCOUNTER — Emergency Department (HOSPITAL_COMMUNITY)
Admission: EM | Admit: 2022-07-30 | Discharge: 2022-07-30 | Disposition: A | Discharge status: Home / Self Care | Payer: Medicaid Other | Attending: Emergency Medicine | Admitting: Emergency Medicine

## 2022-07-30 DIAGNOSIS — K047 Periapical abscess without sinus: Secondary | ICD-10-CM | POA: Insufficient documentation

## 2022-07-30 DIAGNOSIS — R220 Localized swelling, mass and lump, head: Secondary | ICD-10-CM | POA: Diagnosis present

## 2022-07-30 MED ORDER — PENICILLIN V POTASSIUM 250 MG PO TABS
500.0000 mg | ORAL_TABLET | Freq: Once | ORAL | Status: AC
  Administered 2022-07-30: 500 mg via ORAL
  Filled 2022-07-30: qty 2

## 2022-07-30 NOTE — ED Provider Notes (Signed)
[facility] Emergency Department Provider Note MRN:  [id number]  Arrival date & time: 07/30/22     Chief Complaint   Facial Swelling   History of Present Illness   Marissa Chavez is a 21 y.o. year-old female with no pertinent past medical history presenting to the ED with chief complaint of facial swelling.  Swelling to the right side of the face, pain to one of the upper molars on the same side.  Recently diagnosed with a tooth infection.  Prescribed antibiotics.  She took a nap and then woke up and her face was more swollen and this concerned her, here for repeat evaluation.  Review of Systems  A thorough review of systems was obtained and all systems are negative except as noted in the HPI and PMH.   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)     Past Surgical History:  Procedure Laterality Date   NO PAST SURGERIES      Family History  Problem Relation Age of Onset   Hypertension Mother    Depression Father    Hypertension Maternal Grandfather    Hypertension Paternal Grandmother    Kidney disease Paternal Grandmother     Social History   Socioeconomic History   Marital status: Single    Spouse name: Not on file   Number of children: Not on file   Years of education: Not on file   Highest education level: Not on file  Occupational History   Not on file  Tobacco Use   Smoking status: Never   Smokeless tobacco: Never  Vaping Use   Vaping Use: Never used  Substance and Sexual Activity   Alcohol use: Not Currently   Drug use: Not Currently   Sexual activity: Yes    Birth control/protection: None  Other Topics Concern   Not on file  Social History Narrative   Not on file   Social Determinants of Health   Financial Resource Strain: Not on file  Food Insecurity: No Food Insecurity (07/27/2021)   Hunger  Vital Sign    Worried About Running Out of Food in the Last Year: Never true    Ran Out of Food in the Last Year: Never true  Transportation Needs: No Transportation Needs (07/27/2021)   PRAPARE - Administrator, Civil Service (Medical): No    Lack of Transportation (Non-Medical): No  Physical Activity: Not on file  Stress: Not on file  Social Connections: Not on file  Intimate Partner Violence: Not on file     Physical Exam   Vitals:   07/30/22 0605  BP: 122/67  Pulse: 66  Resp: 16  Temp: 98.8 F (37.1 C)  SpO2: 100%    CONSTITUTIONAL: Well-appearing, NAD NEURO/PSYCH:  Alert and oriented x 3, no focal deficits EYES:  eyes equal and reactive ENT/NECK:  no LAD, no JVD, tender to the right upper molar, adjacent buccal edema CARDIO: Regular rate, well-perfused, normal S1 and S2 PULM:  CTAB no wheezing or rhonchi GI/GU:  non-distended, non-tender MSK/SPINE:  No gross deformities, no edema SKIN:  no rash, atraumatic   *Additional and/or pertinent findings included in MDM below  Diagnostic and Interventional Summary    EKG Interpretation  Date/Time:    Ventricular Rate:    PR Interval:    QRS Duration:   QT  Interval:    QTC Calculation:   R Axis:     Text Interpretation:         Labs Reviewed - No data to display  No orders to display    Medications  penicillin v potassium (VEETID) tablet 500 mg (has no administration in time range)     Procedures  /  Critical Care Procedures  ED Course and Medical Decision Making  Initial Impression and Ddx History and physical is consistent with a tooth infection with some local edema.  I do not see or palpate any gingival abscess.  No systemic symptoms, no airway concerns.  We discussed the pros and cons of advanced imaging, it seems best given her age to avoid CT.  It is most likely that simply laying flat exacerbated the swelling, provided reassurance, return precautions, she will see dentistry on  Tuesday.  Past medical/surgical history that increases complexity of ED encounter: None  Interpretation of Diagnostics Laboratory and/or imaging options to aid in the diagnosis/care of the patient were considered.  After careful history and physical examination, it was determined that there was no indication for diagnostics at this time.  Patient Reassessment and Ultimate Disposition/Management     Discharge  Patient management required discussion with the following services or consulting groups:  None  Complexity of Problems Addressed Acute complicated illness or Injury  Additional Data Reviewed and Analyzed Further history obtained from: Prior ED visit notes  Additional Factors Impacting ED Encounter Risk None  Elmer Sow. Pilar Plate, MD [facility] [email or web address]  Final Clinical Impressions(s) / ED Diagnoses     ICD-10-CM   1. Dental infection  K04.7       ED Discharge Orders     None        Discharge Instructions Discussed with and Provided to Patient:    Discharge Instructions      You were evaluated in the Emergency Department and after careful evaluation, we did not find any emergent condition requiring admission or further testing in the hospital.  Your exam/testing today was overall reassuring.  Recommend taking the penicillin antibiotic as directed, using the Naprosyn anti-inflammatory for pain.  Also recommend cold compresses during the day today to help with the swelling.  Please return to the Emergency Department if you experience any worsening of your condition.  Thank you for allowing [place] to be a part of your care.       Sabas Sous, MD 07/30/22 [phone]

## 2022-07-30 NOTE — ED Notes (Signed)
Ice pack provided with instructions on cold compresses; pt verbalized understanding

## 2022-07-30 NOTE — Discharge Instructions (Signed)
You were evaluated in the Emergency Department and after careful evaluation, we did not find any emergent condition requiring admission or further testing in the hospital.  Your exam/testing today was overall reassuring.  Recommend taking the penicillin antibiotic as directed, using the Naprosyn anti-inflammatory for pain.  Also recommend cold compresses during the day today to help with the swelling.  Please return to the Emergency Department if you experience any worsening of your condition.  Thank you for allowing [place] to be a part of your care.

## 2022-07-30 NOTE — ED Triage Notes (Signed)
Patient reports increasing swelling at right face , seen here this evening received Naproxen and Penicillin , prescribed with antibiotic/pain medication .

## 2022-10-08 ENCOUNTER — Ambulatory Visit
Admission: EM | Admit: 2022-10-08 | Discharge: 2022-10-08 | Disposition: A | Discharge status: Home / Self Care | Payer: Medicaid Other | Attending: Urgent Care | Admitting: Urgent Care

## 2022-10-08 DIAGNOSIS — K047 Periapical abscess without sinus: Secondary | ICD-10-CM

## 2022-10-08 MED ORDER — AMOXICILLIN 875 MG PO TABS: 875.0000 mg | ORAL_TABLET | Freq: Two times a day (BID) | ORAL | 0 refills | Status: AC

## 2022-10-08 MED ORDER — NAPROXEN 500 MG PO TABS: 500.0000 mg | ORAL_TABLET | Freq: Two times a day (BID) | ORAL | 0 refills | Status: AC

## 2022-10-08 MED ORDER — FLUCONAZOLE 150 MG PO TABS: 150.0000 mg | ORAL_TABLET | ORAL | 0 refills | Status: AC

## 2022-10-08 NOTE — ED Provider Notes (Signed)
[facility]  Note:  This document was prepared using Systems analyst and may include unintentional dictation errors.  MRN: [id number] DOB: 2001-02-26  Subjective:   Marissa Chavez is a 21 y.o. female presenting for 2-day history of acute onset persistent left upper dental pain.  Patient is supposed to have dental work, Facilities manager extraction.  Unfortunately they are unable to proceed with their plan unless she starts an antibiotic course.  Patient has an appointment coming up this week.  No current facility-administered medications for this encounter.  Current Outpatient Medications:    acetaminophen (TYLENOL) 500 MG tablet, Take 2 tablets (1,000 mg total) by mouth every 8 (eight) hours as needed (pain). (Patient not taking: Reported on 11/10/2021), Disp: 60 tablet, Rfl: 0   ferrous sulfate (FERROUSUL) 325 (65 FE) MG tablet, Take 1 tablet (325 mg total) by mouth every other day. (Patient not taking: Reported on 11/10/2021), Disp: 30 tablet, Rfl: 2   metroNIDAZOLE (FLAGYL) 500 MG tablet, Take 1 tablet (500 mg total) by mouth 2 (two) times daily., Disp: 14 tablet, Rfl: 0   naproxen (NAPROSYN) 500 MG tablet, Take 1 tablet (500 mg total) by mouth 2 (two) times daily., Disp: 30 tablet, Rfl: 0   NIFEdipine (ADALAT CC) 30 MG 24 hr tablet, Take 1 tablet (30 mg total) by mouth daily. (Patient not taking: Reported on 11/10/2021), Disp: 30 tablet, Rfl: 0   norethindrone (MICRONOR) 0.35 MG tablet, Take 1 tablet (0.35 mg total) by mouth daily., Disp: 28 tablet, Rfl: 11   Prenatal Vit-Fe Fumarate-FA (MULTIVITAMIN-PRENATAL) 27-0.8 MG TABS tablet, Take 1 tablet by mouth daily at 12 noon. 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)      Past Surgical History:  Procedure Laterality  Date   NO PAST SURGERIES      Family History  Problem Relation Age of Onset   Hypertension Mother    Depression Father    Hypertension Maternal Grandfather    Hypertension Paternal Grandmother    Kidney disease Paternal Grandmother     Social History   Tobacco Use   Smoking status: Never   Smokeless tobacco: Never  Vaping Use   Vaping Use: Never used  Substance Use Topics   Alcohol use: Not Currently   Drug use: Not Currently    ROS   Objective:   Vitals: BP 130/71 (BP Location: Right Arm)   Pulse 80   Temp 97.9 F (36.6 C) (Oral)   Resp 16   LMP 10/07/2022   SpO2 98%   Breastfeeding No   Physical Exam Constitutional:      General: She is not in acute distress.    Appearance: Normal appearance. She is well-developed. She is not ill-appearing, toxic-appearing or diaphoretic.  HENT:     Head: Normocephalic and atraumatic.     Nose: Nose normal.     Mouth/Throat:     Mouth: Mucous membranes are moist.   Eyes:     General: No scleral icterus.       Right eye: No discharge.        Left eye: No discharge.     Extraocular Movements: Extraocular movements intact.  Cardiovascular:     Rate and Rhythm: Normal rate.  Pulmonary:     Effort: Pulmonary  effort is normal.  Skin:    General: Skin is warm and dry.  Neurological:     General: No focal deficit present.     Mental Status: She is alert and oriented to person, place, and time.  Psychiatric:        Mood and Affect: Mood normal.        Behavior: Behavior normal.     Assessment and Plan :   PDMP not reviewed this encounter.  1. Dental infection     Start Augmentin for dental infection/abscess, use naproxen for pain and inflammation. Emphasized need for dental surgeon consult. Counseled patient on potential for adverse effects with medications prescribed/recommended today, strict ER and return-to-clinic precautions discussed, patient verbalized understanding.    Wallis Bamberg, PA-C 10/08/22  1410

## 2022-10-08 NOTE — ED Triage Notes (Signed)
Patient presents to Urgent Care with complaints of left upper dental pain since yesterday. Pt states she has appt scheduled for 11/17 for tooth extraction. States she is req antibiotic treatment to get ahead of the infection before appt tomorrow.

## 2022-12-18 ENCOUNTER — Ambulatory Visit
Admission: EM | Admit: 2022-12-18 | Discharge: 2022-12-18 | Disposition: A | Discharge status: Home / Self Care | Payer: Medicaid Other | Attending: Physician Assistant | Admitting: Physician Assistant

## 2022-12-18 DIAGNOSIS — Z113 Encounter for screening for infections with a predominantly sexual mode of transmission: Secondary | ICD-10-CM

## 2022-12-18 DIAGNOSIS — R350 Frequency of micturition: Secondary | ICD-10-CM | POA: Insufficient documentation

## 2022-12-18 LAB — POCT URINALYSIS DIP (MANUAL ENTRY)
Bilirubin, UA: NEGATIVE
Blood, UA: NEGATIVE
Glucose, UA: NEGATIVE mg/dL
Leukocytes, UA: NEGATIVE
Nitrite, UA: NEGATIVE
Protein Ur, POC: 30 mg/dL — AB
Spec Grav, UA: 1.02 (ref 1.010–1.025)
Urobilinogen, UA: 1 E.U./dL
pH, UA: 8.5 — AB (ref 5.0–8.0)

## 2022-12-18 LAB — POCT URINE PREGNANCY: Preg Test, Ur: NEGATIVE

## 2022-12-18 NOTE — ED Provider Notes (Signed)
[facility]  CSN: [id number] Arrival date & time: 12/18/22  1515      History   Chief Complaint Chief Complaint  Patient presents with   Vaginal Discharge   Urinary Frequency    HPI Marissa Chavez is a 22 y.o. female.   Patient here today for evaluation of vaginal discharge and urinary frequency that started a week ago.  She denies any known exposure to STD.  She has not had any abdominal pain, nausea or vomiting.  She denies any genital lesions or rashes.  She does not report treatment for symptoms.  The history is provided by the patient.    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 Authorizing Provider  amoxicillin (AMOXIL) 875 MG tablet Take 1 tablet (875 mg total) by mouth 2 (two) times daily. 10/08/22   Jaynee Eagles, PA-C  fluconazole (DIFLUCAN) 150 MG tablet Take 1 tablet (150 mg total) by mouth once a week. 10/08/22   Jaynee Eagles, PA-C  naproxen (NAPROSYN) 500 MG tablet Take 1 tablet (500 mg total) by mouth 2 (two) times daily with a meal. 10/08/22   Jaynee Eagles, PA-C    Family History Family History  Problem Relation Age of Onset   Hypertension Mother    Depression Father    Hypertension Maternal Grandfather    Hypertension Paternal  Grandmother    Kidney disease Paternal Grandmother     Social History Social History   Tobacco Use   Smoking status: Never   Smokeless tobacco: Never  Vaping Use   Vaping Use: Never used  Substance Use Topics   Alcohol use: Not Currently   Drug use: Not Currently     Allergies   Patient has no known allergies.   Review of Systems Review of Systems  Constitutional:  Negative for chills and fever.  Eyes:  Negative for discharge and redness.  Gastrointestinal:  Negative for abdominal pain, nausea and vomiting.  Genitourinary:  Positive for frequency and vaginal discharge. Negative for dysuria and genital sores.  Musculoskeletal:  Negative for back pain.     Physical Exam Triage Vital Signs ED Triage Vitals  Enc Vitals Group     BP      Pulse      Resp      Temp      Temp src      SpO2      Weight      Height      Head Circumference  Peak Flow      Pain Score      Pain Loc      Pain Edu?      Excl. in [place]?    No data found.  Updated Vital Signs BP 106/65 (BP Location: Left Arm)   Temp 98.7 F (37.1 C) (Oral)   Resp 18   LMP 12/11/2022 (Approximate)   SpO2 100%      Physical Exam Vitals and nursing note reviewed.  Constitutional:      General: She is not in acute distress.    Appearance: Normal appearance. She is not ill-appearing.  HENT:     Head: Normocephalic and atraumatic.  Eyes:     Conjunctiva/sclera: Conjunctivae normal.  Cardiovascular:     Rate and Rhythm: Normal rate.  Pulmonary:     Effort: Pulmonary effort is normal. No respiratory distress.  Neurological:     Mental Status: She is alert.  Psychiatric:        Mood and Affect: Mood normal.        Behavior: Behavior normal.        Thought Content: Thought content normal.      UC Treatments / Results  Labs (all labs ordered are listed, but only abnormal results are displayed) Labs Reviewed  POCT URINALYSIS DIP (MANUAL ENTRY) - Abnormal; Notable for the following  components:      Result Value   Clarity, UA cloudy (*)    Ketones, POC UA trace (5) (*)    pH, UA 8.5 (*)    Protein Ur, POC =30 (*)    All other components within normal limits  URINE CULTURE  HIV ANTIBODY (ROUTINE TESTING W REFLEX)  HEPATITIS PANEL, ACUTE  RPR  POCT URINE PREGNANCY  CERVICOVAGINAL ANCILLARY ONLY    EKG   Radiology No results found.  Procedures Procedures (including critical care time)  Medications Ordered in UC Medications - No data to display  Initial Impression / Assessment and Plan / UC Course  I have reviewed the triage vital signs and the nursing notes.  Pertinent labs & imaging results that were available during my care of the patient were reviewed by me and considered in my medical decision making (see chart for details).    UA with no signs of UTI.  Urine culture ordered. Urine pregnancy screening negative but recommended recheck if she misses a period or continues to be symptomatic. Will send screening for BV, yeast and GC/ Chlamydia/ trichomonas. Patient would like blood work for STD screening as well. Will await results for further recommendation. Encouraged follow up if no gradual improvement or with any worsening.   Final Clinical Impressions(s) / UC Diagnoses   Final diagnoses:  Urinary frequency  Screening for STD (sexually transmitted disease)   Discharge Instructions   None    ED Prescriptions   None    PDMP not reviewed this encounter.   Francene Finders, PA-C 12/18/22 1616

## 2022-12-18 NOTE — ED Triage Notes (Signed)
Pt c/o vaginal d/c and urinary freq x 1 week-NAD-steady gait

## 2022-12-20 ENCOUNTER — Telehealth (HOSPITAL_COMMUNITY): Payer: Self-pay | Admitting: Emergency Medicine

## 2022-12-20 LAB — URINE CULTURE: Culture: NO GROWTH

## 2022-12-20 LAB — CERVICOVAGINAL ANCILLARY ONLY
Bacterial Vaginitis (gardnerella): POSITIVE — AB
Candida Glabrata: NEGATIVE
Candida Vaginitis: NEGATIVE
Chlamydia: NEGATIVE
Comment: NEGATIVE
Comment: NEGATIVE
Comment: NEGATIVE
Comment: NEGATIVE
Comment: NEGATIVE
Comment: NORMAL
Neisseria Gonorrhea: NEGATIVE
Trichomonas: NEGATIVE

## 2022-12-20 MED ORDER — METRONIDAZOLE 500 MG PO TABS: 500.0000 mg | ORAL_TABLET | Freq: Two times a day (BID) | ORAL | 0 refills | Status: DC

## 2022-12-23 LAB — HIV ANTIBODY (ROUTINE TESTING W REFLEX): HIV Screen 4th Generation wRfx: NONREACTIVE

## 2022-12-23 LAB — RPR: RPR Ser Ql: NONREACTIVE

## 2022-12-24 LAB — ACUTE VIRAL HEPATITIS (HAV, HBV, HCV)
HCV Ab: NONREACTIVE
Hep A IgM: NEGATIVE
Hep B C IgM: NEGATIVE
Hepatitis B Surface Ag: NEGATIVE

## 2022-12-24 LAB — HCV INTERPRETATION

## 2022-12-24 LAB — SPECIMEN STATUS REPORT

## 2023-08-08 ENCOUNTER — Ambulatory Visit
Admission: EM | Admit: 2023-08-08 | Discharge: 2023-08-08 | Disposition: A | Discharge status: Home / Self Care | Payer: Medicaid Other | Attending: Family Medicine | Admitting: Family Medicine

## 2023-08-08 DIAGNOSIS — N898 Other specified noninflammatory disorders of vagina: Secondary | ICD-10-CM | POA: Diagnosis present

## 2023-08-08 MED ORDER — METRONIDAZOLE 500 MG PO TABS: 500.0000 mg | ORAL_TABLET | Freq: Two times a day (BID) | ORAL | 0 refills | Status: DC

## 2023-08-08 NOTE — ED Triage Notes (Signed)
Pt reports vaginal discomfort and on and ff white vaginal discharge x 2 weeks.

## 2023-08-08 NOTE — Discharge Instructions (Signed)
We will treat for BV while awaiting your vaginal swab results and adjust if needed.  Apply Aquaphor to the area of irritation to keep from having more pain and irritation when urine is passing.  Follow-up with OB/GYN if persisting

## 2023-08-08 NOTE — ED Provider Notes (Signed)
[facility]    CSN: [id number] Arrival date & time: 08/08/23  1743      History   Chief Complaint Chief Complaint  Patient presents with   Vaginal Discharge    HPI Marissa Chavez is a 22 y.o. female.   Presenting today with 2-week history of vaginal itching, discomfort and yellow-white vaginal discharge.  She also noticed a small tear to the vulvar region and some surrounding bumps today.  Denies exposures to STDs, concern for pregnancy, dysuria, urinary frequency, pelvic or abdominal pain, fevers.  So far not trying anything over-the-counter for symptoms.  Does have a history of BV infections and states this feels similar.    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 Authorizing Provider  amoxicillin (AMOXIL) 875 MG tablet Take 1 tablet (875 mg total) by mouth 2 (two) times daily. 10/08/22   Wallis Bamberg, PA-C  fluconazole (DIFLUCAN) 150 MG tablet Take 1 tablet (150 mg total) by mouth once a week. 10/08/22   Wallis Bamberg, PA-C  metroNIDAZOLE (FLAGYL) 500 MG tablet Take 1 tablet (500 mg total) by mouth 2 (two) times daily. 08/08/23   Particia Nearing, PA-C  naproxen (NAPROSYN) 500 MG tablet Take 1 tablet (500 mg total) by mouth 2 (two) times daily with a  meal. 10/08/22   Wallis Bamberg, PA-C    Family History Family History  Problem Relation Age of Onset   Hypertension Mother    Depression Father    Hypertension Maternal Grandfather    Hypertension Paternal Grandmother    Kidney disease Paternal Grandmother     Social History Social History   Tobacco Use   Smoking status: Never   Smokeless tobacco: Never  Vaping Use   Vaping status: Never Used  Substance Use Topics   Alcohol use: Not Currently   Drug use: Not Currently     Allergies   Patient has no known allergies.   Review of Systems Review of Systems Per HPI  Physical Exam Triage Vital Signs ED Triage Vitals  Encounter Vitals Group     BP 08/08/23 1832 112/76     Systolic BP Percentile --      Diastolic BP Percentile --      Pulse Rate 08/08/23 1832 68     Resp 08/08/23 1832 18     Temp 08/08/23 1832 99 F (37.2 C)     Temp Source 08/08/23 1832 Oral     SpO2 08/08/23 1832 98 %  Weight --      Height --      Head Circumference --      Peak Flow --      Pain Score 08/08/23 1843 4     Pain Loc --      Pain Education --      Exclude from Growth Chart --    No data found.  Updated Vital Signs BP 112/76 (BP Location: Right Arm)   Pulse 68   Temp 99 F (37.2 C) (Oral)   Resp 18   LMP 07/15/2023 (Approximate)   SpO2 98%   Breastfeeding No   Visual Acuity Right Eye Distance:   Left Eye Distance:   Bilateral Distance:    Right Eye Near:   Left Eye Near:    Bilateral Near:     Physical Exam Vitals and nursing note reviewed. Exam conducted with a chaperone present.  Constitutional:      Appearance: Normal appearance. She is not ill-appearing.  HENT:     Head: Atraumatic.     Mouth/Throat:     Mouth: Mucous membranes are moist.     Pharynx: Oropharynx is clear.  Eyes:     Extraocular Movements: Extraocular movements intact.     Conjunctiva/sclera: Conjunctivae normal.  Cardiovascular:     Rate and Rhythm: Normal rate and regular rhythm.      Heart sounds: Normal heart sounds.  Pulmonary:     Effort: Pulmonary effort is normal.     Breath sounds: Normal breath sounds.  Abdominal:     General: Bowel sounds are normal. There is no distension.     Palpations: Abdomen is soft.     Tenderness: There is no abdominal tenderness. There is no right CVA tenderness, left CVA tenderness or guarding.  Genitourinary:    Comments: Small nondiscolored papular areas to left vulva, very small flesh toned skin flap to left vulva.  No surrounding erythema, edema, drainage or bleeding.  No obvious discharge currently Musculoskeletal:        General: Normal range of motion.     Cervical back: Normal range of motion and neck supple.  Skin:    General: Skin is warm and dry.  Neurological:     Mental Status: She is alert and oriented to person, place, and time.     Motor: No weakness.     Gait: Gait normal.  Psychiatric:        Mood and Affect: Mood normal.        Thought Content: Thought content normal.        Judgment: Judgment normal.    UC Treatments / Results  Labs (all labs ordered are listed, but only abnormal results are displayed) Labs Reviewed  CERVICOVAGINAL ANCILLARY ONLY    EKG   Radiology No results found.  Procedures Procedures (including critical care time)  Medications Ordered in UC Medications - No data to display  Initial Impression / Assessment and Plan / UC Course  I have reviewed the triage vital signs and the nursing notes.  Pertinent labs & imaging results that were available during my care of the patient were reviewed by me and considered in my medical decision making (see chart for details).     Vitals and exam reassuring today, and exam overall benign appearing.  Discussed Aquaphor as a barrier cream as urine passing seems to be irritating the areas.  Vaginal swab pending, given her history of BV and similar symptoms will treat with metronidazole while awaiting results and  adjust if needed based  on these.  Follow-up with OB/GYN for recheck.  Final Clinical Impressions(s) / UC Diagnoses   Final diagnoses:  Vaginal discharge  Vaginal irritation     Discharge Instructions      We will treat for BV while awaiting your vaginal swab results and adjust if needed.  Apply Aquaphor to the area of irritation to keep from having more pain and irritation when urine is passing.  Follow-up with OB/GYN if persisting    ED Prescriptions     Medication Sig Dispense Auth. Provider   metroNIDAZOLE (FLAGYL) 500 MG tablet Take 1 tablet (500 mg total) by mouth 2 (two) times daily. 14 tablet Particia Nearing, [place]      PDMP not reviewed this encounter.   Particia Nearing, [place] 08/08/23 1929

## 2023-08-09 LAB — CERVICOVAGINAL ANCILLARY ONLY
Bacterial Vaginitis (gardnerella): NEGATIVE
Candida Glabrata: NEGATIVE
Candida Vaginitis: NEGATIVE
Chlamydia: NEGATIVE
Comment: NEGATIVE
Comment: NEGATIVE
Comment: NEGATIVE
Comment: NEGATIVE
Comment: NEGATIVE
Comment: NORMAL
Neisseria Gonorrhea: NEGATIVE
Trichomonas: NEGATIVE

## 2024-02-22 ENCOUNTER — Ambulatory Visit (HOSPITAL_COMMUNITY)
Admission: RE | Admit: 2024-02-22 | Discharge: 2024-02-22 | Disposition: A | Discharge status: Home / Self Care | Source: Ambulatory Visit | Attending: Family Medicine | Admitting: Family Medicine

## 2024-02-22 ENCOUNTER — Encounter (HOSPITAL_COMMUNITY): Payer: Self-pay

## 2024-02-22 VITALS — BP 109/59 | HR 80 | Temp 98.0°F | Resp 18

## 2024-02-22 DIAGNOSIS — J302 Other seasonal allergic rhinitis: Secondary | ICD-10-CM | POA: Diagnosis not present

## 2024-02-22 DIAGNOSIS — J029 Acute pharyngitis, unspecified: Secondary | ICD-10-CM

## 2024-02-22 LAB — POCT RAPID STREP A (OFFICE): Rapid Strep A Screen: NEGATIVE

## 2024-02-22 NOTE — ED Triage Notes (Signed)
 Pt states had a sore throat on Monday and now its just dry. States wants to make sure she isn't contagious. Took dayquil with relief.

## 2024-02-22 NOTE — ED Provider Notes (Signed)
 [facility]    CSN: [id number] Arrival date & time: 02/22/24  1242      History   Chief Complaint Chief Complaint  Patient presents with   Sore Throat    Headache, Runny Nose, Mucus - Entered by patient    HPI Marissa Chavez is a 23 y.o. female.   The patient presents with 4 days of sore throat that initially was improved after Monday but continues to have a sore throat, particularly in the morning after waking up, improves throughout the day, and is associated with some runny nose.  She has not had any fevers, chills, rashes, ear pain, sinus pain or pressure, cough, and has been eating and drinking well. She is worried about exposing her son to an infection.   The history is provided by the patient.  Sore Throat Pertinent negatives include no chest pain and no shortness of breath.    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 Authorizing Provider  amoxicillin (AMOXIL) 875 MG tablet Take 1 tablet (875 mg total) by mouth 2 (two) times daily. 10/08/22   Wallis Bamberg, PA-C  fluconazole (DIFLUCAN) 150 MG tablet Take 1 tablet (150 mg total) by mouth once a week. 10/08/22   Wallis Bamberg, PA-C  naproxen (NAPROSYN) 500 MG tablet Take 1 tablet (500 mg total) by  mouth 2 (two) times daily with a meal. 10/08/22   Wallis Bamberg, PA-C    Family History Family History  Problem Relation Age of Onset   Hypertension Mother    Depression Father    Hypertension Maternal Grandfather    Hypertension Paternal Grandmother    Kidney disease Paternal Grandmother     Social History Social History   Tobacco Use   Smoking status: Never   Smokeless tobacco: Never  Vaping Use   Vaping status: Never Used  Substance Use Topics   Alcohol use: Not Currently   Drug use: Not Currently     Allergies   Patient has no known allergies.   Review of Systems Review of Systems  Constitutional:  Negative for appetite change, chills, fatigue and fever.  HENT:  Positive for postnasal drip, sneezing and sore throat. Negative for congestion, ear pain, rhinorrhea, sinus pressure, sinus pain, tinnitus, trouble swallowing and voice change.   Eyes:  Negative for photophobia and discharge.  Respiratory:  Negative for shortness of breath.   Cardiovascular:  Negative for chest pain.  Gastrointestinal:  Negative for diarrhea, nausea and vomiting.  Musculoskeletal:  Negative for arthralgias and myalgias.  Skin:  Negative for rash.  Allergic/Immunologic: Positive for environmental allergies.     Physical Exam Triage Vital Signs ED Triage Vitals  Encounter Vitals Group     BP 02/22/24 1308 (!) 109/59     Systolic BP Percentile --      Diastolic BP Percentile --      Pulse Rate 02/22/24 1308 80     Resp 02/22/24 1308 18     Temp 02/22/24 1308 98 F (36.7 C)     Temp Source 02/22/24 1308 Oral     SpO2 02/22/24 1308 98 %     Weight --      Height --      Head Circumference --      Peak Flow --      Pain Score 02/22/24 1309 0     Pain Loc --      Pain Education --      Exclude from Growth Chart --    No data found.  Updated Vital Signs BP (!) 109/59 (BP Location: Left Arm)   Pulse 80   Temp 98 F (36.7 C) (Oral)   Resp 18   LMP 02/08/2024 (Approximate)    SpO2 98%   Visual Acuity Right Eye Distance:   Left Eye Distance:   Bilateral Distance:    Right Eye Near:   Left Eye Near:    Bilateral Near:     Physical Exam Vitals reviewed.  Constitutional:      General: She is not in acute distress.    Appearance: She is well-developed and normal weight. She is not ill-appearing, toxic-appearing or diaphoretic.  HENT:     Head: Normocephalic and atraumatic.     Nose: No congestion.     Mouth/Throat:     Mouth: Mucous membranes are moist. Mucous membranes are pale.     Pharynx: Uvula midline. No uvula swelling.     Tonsils: No tonsillar exudate or tonsillar abscesses. 1+ on the right. 1+ on the left.     Comments: 1+ bilateral tonsillar edema with trace erythema at the uvula Copious clear, postnasal drainage with some cobblestoning No purulent discharge or ulcerations Eyes:     Conjunctiva/sclera: Conjunctivae normal.     Pupils: Pupils are equal, round, and reactive to light.  Pulmonary:     Effort: Pulmonary effort is normal.  Musculoskeletal:     Cervical back: Normal range of motion and neck supple.  Skin:    General: Skin is warm.     Capillary Refill: Capillary refill takes 2 to 3 seconds.  Neurological:     General: No focal deficit present.     Mental Status: She is alert.      UC Treatments / Results  Labs (all labs ordered are listed, but only abnormal results are displayed) Labs Reviewed  POCT RAPID STREP A (OFFICE)    EKG   Radiology No results found.  Procedures Procedures (including critical care time)  Medications Ordered in UC Medications - No data to display  Initial Impression / Assessment and Plan / UC Course  I have reviewed the triage vital signs and the nursing notes.  Pertinent labs & imaging results that were available during my care of the patient were reviewed by me and considered in my medical decision making (see chart for details).     Seasonal Allergies - The patient has had some  allergies and several days of sore throat in the morning without fevers, chills,  sinus pain, cough, or other issues.  - Given the erythema and slight tonsillar swelling on exam I discussed options for testing with the patient. Because of the risk of exposing her son she would like to test for strep.  - Rapid Strep A negative - We discussed management of allergies including flonase and antihistamines.  - The patient voiced understanding and all questions were answered.   Final Clinical Impressions(s) / UC Diagnoses   Final diagnoses:  Sore throat  Seasonal allergies   Discharge Instructions   None    ED Prescriptions   None    PDMP not reviewed this encounter.   Ivor Messier, MD 02/22/24 [phone]

## 2024-12-12 ENCOUNTER — Ambulatory Visit
Admission: EM | Admit: 2024-12-12 | Discharge: 2024-12-12 | Disposition: A | Discharge status: Home / Self Care | Attending: Emergency Medicine | Admitting: Emergency Medicine

## 2024-12-12 DIAGNOSIS — R590 Localized enlarged lymph nodes: Secondary | ICD-10-CM

## 2024-12-12 MED ORDER — PREDNISONE 10 MG (21) PO TBPK: ORAL_TABLET | Freq: Every day | ORAL | 0 refills | Status: AC

## 2024-12-12 NOTE — Discharge Instructions (Addendum)
 Today you are evaluated for the lump in the throat  This appears most consistent with a lymph node which is a part of a drainage system and can swell at times  You may begin use of prednisone  every morning with food to help reduce the size and help with pain, may take Tylenol  in addition  May massage the area as tolerable  May apply heating pads over the affected area  If symptoms persist past 2 weeks please return for reevaluation

## 2024-12-12 NOTE — ED Provider Notes (Signed)
 " [facility]    CSN: [id number] Arrival date & time: 12/12/24  1527      History   Chief Complaint Chief Complaint  Patient presents with   Throat Problem    HPI Biviana KINDRED HEYING is a 24 y.o. female.   Patient presents for evaluation of a lump in the throat present for 4 days.  Can be felt with swallowing causing pain with eating but tolerable to food and liquids.  Denies presence of an actual sore throat, fever, congestion or coughing.  Has not attempted treatment.     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 Authorizing Provider  amoxicillin  (AMOXIL ) 875 MG tablet Take 1 tablet (875 mg total) by mouth 2 (two) times daily. 10/08/22   Christopher Savannah, PA-C  fluconazole  (DIFLUCAN ) 150 MG tablet Take 1 tablet (150 mg total) by mouth once a week. 10/08/22   Christopher Savannah, PA-C  naproxen  (NAPROSYN ) 500 MG tablet Take 1 tablet (500 mg total) by mouth 2 (two) times daily with a meal. 10/08/22   Christopher Savannah, PA-C    Family History Family History  Problem Relation Age of Onset   Hypertension Mother    Depression Father    Hypertension Maternal Grandfather    Hypertension Paternal Grandmother    Kidney disease Paternal Grandmother     Social History Social  History[1]   Allergies   Patient has no known allergies.   Review of Systems Review of Systems   Physical Exam Triage Vital Signs ED Triage Vitals  Encounter Vitals Group     BP 12/12/24 1546 108/68     Girls Systolic BP Percentile --      Girls Diastolic BP Percentile --      Boys Systolic BP Percentile --      Boys Diastolic BP Percentile --      Pulse Rate 12/12/24 1546 77     Resp 12/12/24 1546 18     Temp 12/12/24 1546 98.6 F (37 C)     Temp src --      SpO2 12/12/24 1546 96 %     Weight --      Height --      Head Circumference --      Peak Flow --      Pain Score 12/12/24 1545 0     Pain Loc --      Pain Education --      Exclude from Growth Chart --  No data found.  Updated Vital Signs BP 108/68 (BP Location: Right Arm)   Pulse 77   Temp 98.6 F (37 C)   Resp 18   LMP 11/28/2024 (Exact Date)   SpO2 96%   Visual Acuity Right Eye Distance:   Left Eye Distance:   Bilateral Distance:    Right Eye Near:   Left Eye Near:    Bilateral Near:     Physical Exam Constitutional:      Appearance: Normal appearance.  Eyes:     Extraocular Movements: Extraocular movements intact.  Pulmonary:     Effort: Pulmonary effort is normal.  Musculoskeletal:     Cervical back: Normal range of motion.  Lymphadenopathy:     Cervical: Cervical adenopathy present.  Neurological:     Mental Status: She is alert and oriented to person, place, and time. Mental status is at baseline.      UC Treatments / Results  Labs (all labs ordered are listed, but only abnormal results are displayed) Labs Reviewed - No data to display  EKG   Radiology No results found.  Procedures Procedures (including critical care time)  Medications Ordered in UC Medications - No data to display  Initial Impression / Assessment and Plan / UC Course  I have reviewed the triage vital signs and the nursing notes.  Pertinent labs & imaging results that were available during my  care of the patient were reviewed by me and considered in my medical decision making (see chart for details).  Cervical lymphadenopathy  Lymph nodes enlarged on exam, no erythema tonsillar adenopathy or exudate noted to the oropharynx therefore rapid strep testing deferred, etiology most likely viral, prescribed prednisone  and discussed administration and recommended additional nonpharmacological supportive care with close monitoring advised to follow-up if symptoms persist past 2 weeks Final Clinical Impressions(s) / UC Diagnoses   Final diagnoses:  Sore throat  Alpha thalassemia silent carrier   Discharge Instructions   None    ED Prescriptions   None    PDMP not reviewed this encounter.     [1]  Social History Tobacco Use   Smoking status: Never   Smokeless tobacco: Never  Vaping Use   Vaping status: Never Used  Substance Use Topics   Alcohol use: Not Currently   Drug use: Not Currently     Teresa Shelba SAUNDERS, NP 12/12/24 1634  "

## 2024-12-12 NOTE — ED Triage Notes (Signed)
 Pt present with c/o a lump in the back of her throat. Pt states when she swallows she feels a lump. States she does not have a sore throat. Pt states it is painful when swallowing or touching the lump.
